# Patient Record
Sex: Female | Born: 1960 | Race: White | Hispanic: No
Health system: Southern US, Community
[De-identification: ages and names within clinical notes are randomized; demographics above are authoritative.]

## PROBLEM LIST (undated history)

## (undated) DIAGNOSIS — R51 Headache: Secondary | ICD-10-CM

## (undated) DIAGNOSIS — G8929 Other chronic pain: Secondary | ICD-10-CM

## (undated) DIAGNOSIS — M1990 Unspecified osteoarthritis, unspecified site: Secondary | ICD-10-CM

## (undated) DIAGNOSIS — F419 Anxiety disorder, unspecified: Secondary | ICD-10-CM

## (undated) DIAGNOSIS — I10 Essential (primary) hypertension: Secondary | ICD-10-CM

## (undated) DIAGNOSIS — K219 Gastro-esophageal reflux disease without esophagitis: Secondary | ICD-10-CM

## (undated) DIAGNOSIS — M5430 Sciatica, unspecified side: Secondary | ICD-10-CM

## (undated) DIAGNOSIS — G4730 Sleep apnea, unspecified: Secondary | ICD-10-CM

## (undated) DIAGNOSIS — M549 Dorsalgia, unspecified: Secondary | ICD-10-CM

## (undated) DIAGNOSIS — R0602 Shortness of breath: Secondary | ICD-10-CM

## (undated) DIAGNOSIS — J449 Chronic obstructive pulmonary disease, unspecified: Secondary | ICD-10-CM

## (undated) DIAGNOSIS — R569 Unspecified convulsions: Secondary | ICD-10-CM

## (undated) DIAGNOSIS — J40 Bronchitis, not specified as acute or chronic: Secondary | ICD-10-CM

## (undated) HISTORY — PX: TUBAL LIGATION: SHX77

## (undated) HISTORY — PX: CHOLECYSTECTOMY: SHX55

## (undated) HISTORY — PX: ABDOMINAL HYSTERECTOMY: SHX81

---

## 2004-02-12 ENCOUNTER — Emergency Department (HOSPITAL_COMMUNITY): Admission: EM | Admit: 2004-02-12 | Discharge: 2004-02-12 | Payer: Self-pay | Admitting: Emergency Medicine

## 2004-05-29 ENCOUNTER — Ambulatory Visit: Payer: Self-pay | Admitting: Family Medicine

## 2004-06-30 ENCOUNTER — Ambulatory Visit: Payer: Self-pay | Admitting: Family Medicine

## 2004-07-01 ENCOUNTER — Ambulatory Visit (HOSPITAL_COMMUNITY): Admission: RE | Admit: 2004-07-01 | Discharge: 2004-07-01 | Payer: Self-pay | Admitting: Family Medicine

## 2004-07-09 ENCOUNTER — Ambulatory Visit (HOSPITAL_COMMUNITY): Admission: RE | Admit: 2004-07-09 | Discharge: 2004-07-09 | Payer: Self-pay | Admitting: Family Medicine

## 2004-07-10 ENCOUNTER — Emergency Department (HOSPITAL_COMMUNITY): Admission: EM | Admit: 2004-07-10 | Discharge: 2004-07-10 | Payer: Self-pay | Admitting: Emergency Medicine

## 2004-07-10 ENCOUNTER — Encounter (HOSPITAL_COMMUNITY): Admission: RE | Admit: 2004-07-10 | Discharge: 2004-08-09 | Payer: Self-pay | Admitting: Family Medicine

## 2004-07-15 ENCOUNTER — Encounter (INDEPENDENT_AMBULATORY_CARE_PROVIDER_SITE_OTHER): Payer: Self-pay | Admitting: Internal Medicine

## 2004-07-15 ENCOUNTER — Ambulatory Visit: Payer: Self-pay | Admitting: Family Medicine

## 2004-07-16 ENCOUNTER — Encounter (INDEPENDENT_AMBULATORY_CARE_PROVIDER_SITE_OTHER): Payer: Self-pay | Admitting: Internal Medicine

## 2004-07-16 LAB — CONVERTED CEMR LAB
LDL Cholesterol: 105 mg/dL
VLDL: 22 mg/dL

## 2004-08-27 ENCOUNTER — Ambulatory Visit: Payer: Self-pay | Admitting: Family Medicine

## 2004-10-28 ENCOUNTER — Ambulatory Visit: Payer: Self-pay | Admitting: Family Medicine

## 2004-12-04 ENCOUNTER — Ambulatory Visit: Payer: Self-pay | Admitting: Family Medicine

## 2005-02-03 ENCOUNTER — Ambulatory Visit: Payer: Self-pay | Admitting: Family Medicine

## 2005-02-05 ENCOUNTER — Emergency Department (HOSPITAL_COMMUNITY): Admission: EM | Admit: 2005-02-05 | Discharge: 2005-02-05 | Payer: Self-pay | Admitting: Emergency Medicine

## 2005-04-17 ENCOUNTER — Ambulatory Visit: Payer: Self-pay | Admitting: Internal Medicine

## 2005-07-09 ENCOUNTER — Ambulatory Visit: Payer: Self-pay | Admitting: Internal Medicine

## 2005-07-09 ENCOUNTER — Ambulatory Visit (HOSPITAL_COMMUNITY): Admission: RE | Admit: 2005-07-09 | Discharge: 2005-07-09 | Payer: Self-pay | Admitting: Internal Medicine

## 2005-07-24 ENCOUNTER — Ambulatory Visit: Payer: Self-pay | Admitting: Internal Medicine

## 2005-08-06 ENCOUNTER — Ambulatory Visit: Payer: Self-pay | Admitting: Internal Medicine

## 2005-12-29 ENCOUNTER — Ambulatory Visit: Payer: Self-pay | Admitting: Internal Medicine

## 2006-02-04 ENCOUNTER — Ambulatory Visit: Payer: Self-pay | Admitting: Internal Medicine

## 2006-02-05 ENCOUNTER — Encounter (INDEPENDENT_AMBULATORY_CARE_PROVIDER_SITE_OTHER): Payer: Self-pay | Admitting: Internal Medicine

## 2006-02-24 ENCOUNTER — Ambulatory Visit (HOSPITAL_COMMUNITY): Admission: RE | Admit: 2006-02-24 | Discharge: 2006-02-24 | Payer: Self-pay | Admitting: Neurology

## 2006-03-09 ENCOUNTER — Ambulatory Visit: Payer: Self-pay | Admitting: Internal Medicine

## 2006-03-09 LAB — CONVERTED CEMR LAB
CO2: 23 meq/L (ref 19–32)
Calcium: 8.8 mg/dL (ref 8.4–10.5)
Creatinine, Ser: 0.8 mg/dL (ref 0.40–1.20)
Glucose, Bld: 73 mg/dL (ref 70–99)
Hemoglobin: 13.5 g/dL (ref 12.0–15.0)
Lymphocytes Relative: 33 % (ref 12–46)
MCHC: 31.2 g/dL (ref 30.0–36.0)
Monocytes Absolute: 0.3 10*3/uL (ref 0.2–0.7)
Monocytes Relative: 7 % (ref 3–11)
Neutro Abs: 2.4 10*3/uL (ref 1.7–7.7)
RBC: 4.55 M/uL (ref 3.87–5.11)

## 2006-03-16 ENCOUNTER — Encounter: Payer: Self-pay | Admitting: Internal Medicine

## 2006-03-16 DIAGNOSIS — G43909 Migraine, unspecified, not intractable, without status migrainosus: Secondary | ICD-10-CM | POA: Insufficient documentation

## 2006-03-16 DIAGNOSIS — M1990 Unspecified osteoarthritis, unspecified site: Secondary | ICD-10-CM | POA: Insufficient documentation

## 2006-03-16 DIAGNOSIS — M545 Low back pain: Secondary | ICD-10-CM

## 2006-03-16 DIAGNOSIS — J449 Chronic obstructive pulmonary disease, unspecified: Secondary | ICD-10-CM

## 2006-03-16 DIAGNOSIS — K219 Gastro-esophageal reflux disease without esophagitis: Secondary | ICD-10-CM

## 2006-03-16 DIAGNOSIS — G5600 Carpal tunnel syndrome, unspecified upper limb: Secondary | ICD-10-CM | POA: Insufficient documentation

## 2006-03-16 DIAGNOSIS — D649 Anemia, unspecified: Secondary | ICD-10-CM

## 2006-03-16 DIAGNOSIS — F3289 Other specified depressive episodes: Secondary | ICD-10-CM | POA: Insufficient documentation

## 2006-03-16 DIAGNOSIS — J4489 Other specified chronic obstructive pulmonary disease: Secondary | ICD-10-CM | POA: Insufficient documentation

## 2006-03-16 DIAGNOSIS — J45909 Unspecified asthma, uncomplicated: Secondary | ICD-10-CM | POA: Insufficient documentation

## 2006-03-16 DIAGNOSIS — I498 Other specified cardiac arrhythmias: Secondary | ICD-10-CM

## 2006-03-16 DIAGNOSIS — I499 Cardiac arrhythmia, unspecified: Secondary | ICD-10-CM | POA: Insufficient documentation

## 2006-03-16 DIAGNOSIS — R569 Unspecified convulsions: Secondary | ICD-10-CM

## 2006-03-16 DIAGNOSIS — N318 Other neuromuscular dysfunction of bladder: Secondary | ICD-10-CM

## 2006-03-16 DIAGNOSIS — F329 Major depressive disorder, single episode, unspecified: Secondary | ICD-10-CM

## 2006-03-16 DIAGNOSIS — F411 Generalized anxiety disorder: Secondary | ICD-10-CM | POA: Insufficient documentation

## 2006-03-18 ENCOUNTER — Ambulatory Visit: Payer: Self-pay | Admitting: Internal Medicine

## 2006-04-06 ENCOUNTER — Ambulatory Visit: Payer: Self-pay | Admitting: Internal Medicine

## 2006-04-06 DIAGNOSIS — J441 Chronic obstructive pulmonary disease with (acute) exacerbation: Secondary | ICD-10-CM

## 2006-04-06 DIAGNOSIS — F17200 Nicotine dependence, unspecified, uncomplicated: Secondary | ICD-10-CM | POA: Insufficient documentation

## 2006-04-06 DIAGNOSIS — B9789 Other viral agents as the cause of diseases classified elsewhere: Secondary | ICD-10-CM

## 2006-04-19 ENCOUNTER — Encounter (INDEPENDENT_AMBULATORY_CARE_PROVIDER_SITE_OTHER): Payer: Self-pay | Admitting: Internal Medicine

## 2006-05-04 ENCOUNTER — Ambulatory Visit: Payer: Self-pay | Admitting: Internal Medicine

## 2006-05-25 ENCOUNTER — Encounter (INDEPENDENT_AMBULATORY_CARE_PROVIDER_SITE_OTHER): Payer: Self-pay | Admitting: Internal Medicine

## 2006-06-14 ENCOUNTER — Encounter: Payer: Self-pay | Admitting: Internal Medicine

## 2006-06-15 ENCOUNTER — Ambulatory Visit: Payer: Self-pay | Admitting: Internal Medicine

## 2006-06-15 DIAGNOSIS — J309 Allergic rhinitis, unspecified: Secondary | ICD-10-CM | POA: Insufficient documentation

## 2006-08-12 ENCOUNTER — Ambulatory Visit: Payer: Self-pay | Admitting: Internal Medicine

## 2006-09-16 ENCOUNTER — Ambulatory Visit: Payer: Self-pay | Admitting: Internal Medicine

## 2006-09-16 ENCOUNTER — Other Ambulatory Visit: Admission: RE | Admit: 2006-09-16 | Discharge: 2006-09-16 | Payer: Self-pay | Admitting: Internal Medicine

## 2006-09-16 ENCOUNTER — Encounter (INDEPENDENT_AMBULATORY_CARE_PROVIDER_SITE_OTHER): Payer: Self-pay | Admitting: Internal Medicine

## 2006-09-16 DIAGNOSIS — M7710 Lateral epicondylitis, unspecified elbow: Secondary | ICD-10-CM | POA: Insufficient documentation

## 2006-09-16 LAB — CONVERTED CEMR LAB: OCCULT 1: NEGATIVE

## 2006-09-17 ENCOUNTER — Telehealth (INDEPENDENT_AMBULATORY_CARE_PROVIDER_SITE_OTHER): Payer: Self-pay | Admitting: *Deleted

## 2006-09-17 ENCOUNTER — Encounter (INDEPENDENT_AMBULATORY_CARE_PROVIDER_SITE_OTHER): Payer: Self-pay | Admitting: Internal Medicine

## 2006-09-21 DIAGNOSIS — D069 Carcinoma in situ of cervix, unspecified: Secondary | ICD-10-CM | POA: Insufficient documentation

## 2006-09-22 ENCOUNTER — Encounter (INDEPENDENT_AMBULATORY_CARE_PROVIDER_SITE_OTHER): Payer: Self-pay | Admitting: Internal Medicine

## 2006-09-22 ENCOUNTER — Telehealth (INDEPENDENT_AMBULATORY_CARE_PROVIDER_SITE_OTHER): Payer: Self-pay | Admitting: *Deleted

## 2006-09-23 ENCOUNTER — Encounter (INDEPENDENT_AMBULATORY_CARE_PROVIDER_SITE_OTHER): Payer: Self-pay | Admitting: Internal Medicine

## 2006-10-17 ENCOUNTER — Encounter (INDEPENDENT_AMBULATORY_CARE_PROVIDER_SITE_OTHER): Payer: Self-pay | Admitting: Internal Medicine

## 2006-11-10 ENCOUNTER — Other Ambulatory Visit: Admission: RE | Admit: 2006-11-10 | Discharge: 2006-11-10 | Payer: Self-pay | Admitting: Obstetrics and Gynecology

## 2006-11-10 ENCOUNTER — Telehealth (INDEPENDENT_AMBULATORY_CARE_PROVIDER_SITE_OTHER): Payer: Self-pay | Admitting: *Deleted

## 2006-11-10 ENCOUNTER — Encounter (INDEPENDENT_AMBULATORY_CARE_PROVIDER_SITE_OTHER): Payer: Self-pay | Admitting: Internal Medicine

## 2006-11-15 ENCOUNTER — Encounter (INDEPENDENT_AMBULATORY_CARE_PROVIDER_SITE_OTHER): Payer: Self-pay | Admitting: Internal Medicine

## 2006-11-16 ENCOUNTER — Telehealth (INDEPENDENT_AMBULATORY_CARE_PROVIDER_SITE_OTHER): Payer: Self-pay | Admitting: Internal Medicine

## 2006-11-17 ENCOUNTER — Encounter (INDEPENDENT_AMBULATORY_CARE_PROVIDER_SITE_OTHER): Payer: Self-pay | Admitting: Internal Medicine

## 2006-11-19 ENCOUNTER — Encounter (INDEPENDENT_AMBULATORY_CARE_PROVIDER_SITE_OTHER): Payer: Self-pay | Admitting: Internal Medicine

## 2006-11-30 ENCOUNTER — Encounter (INDEPENDENT_AMBULATORY_CARE_PROVIDER_SITE_OTHER): Payer: Self-pay | Admitting: Internal Medicine

## 2007-01-04 ENCOUNTER — Encounter (INDEPENDENT_AMBULATORY_CARE_PROVIDER_SITE_OTHER): Payer: Self-pay | Admitting: Internal Medicine

## 2007-02-09 ENCOUNTER — Ambulatory Visit (HOSPITAL_COMMUNITY): Admission: RE | Admit: 2007-02-09 | Discharge: 2007-02-09 | Payer: Self-pay | Admitting: Obstetrics & Gynecology

## 2007-02-24 ENCOUNTER — Encounter: Payer: Self-pay | Admitting: Family Medicine

## 2007-02-25 ENCOUNTER — Ambulatory Visit (HOSPITAL_COMMUNITY): Admission: RE | Admit: 2007-02-25 | Discharge: 2007-02-25 | Payer: Self-pay | Admitting: *Deleted

## 2007-03-02 ENCOUNTER — Ambulatory Visit (HOSPITAL_COMMUNITY): Admission: RE | Admit: 2007-03-02 | Discharge: 2007-03-02 | Payer: Self-pay | Admitting: *Deleted

## 2007-08-22 ENCOUNTER — Other Ambulatory Visit: Admission: RE | Admit: 2007-08-22 | Discharge: 2007-08-22 | Payer: Self-pay | Admitting: Obstetrics & Gynecology

## 2007-08-29 ENCOUNTER — Encounter: Payer: Self-pay | Admitting: Orthopedic Surgery

## 2007-08-29 ENCOUNTER — Ambulatory Visit (HOSPITAL_COMMUNITY): Admission: RE | Admit: 2007-08-29 | Discharge: 2007-08-29 | Payer: Self-pay | Admitting: Neurology

## 2007-09-12 ENCOUNTER — Ambulatory Visit: Payer: Self-pay | Admitting: Orthopedic Surgery

## 2007-09-12 DIAGNOSIS — M79609 Pain in unspecified limb: Secondary | ICD-10-CM

## 2007-09-12 DIAGNOSIS — M25579 Pain in unspecified ankle and joints of unspecified foot: Secondary | ICD-10-CM

## 2007-09-15 ENCOUNTER — Encounter: Payer: Self-pay | Admitting: Orthopedic Surgery

## 2007-09-26 ENCOUNTER — Telehealth: Payer: Self-pay | Admitting: Orthopedic Surgery

## 2007-09-26 ENCOUNTER — Encounter: Payer: Self-pay | Admitting: Orthopedic Surgery

## 2007-09-29 ENCOUNTER — Encounter (HOSPITAL_COMMUNITY): Admission: RE | Admit: 2007-09-29 | Discharge: 2007-10-29 | Payer: Self-pay | Admitting: Orthopaedic Surgery

## 2007-12-07 ENCOUNTER — Emergency Department (HOSPITAL_COMMUNITY): Admission: EM | Admit: 2007-12-07 | Discharge: 2007-12-07 | Payer: Self-pay | Admitting: Emergency Medicine

## 2008-01-06 ENCOUNTER — Ambulatory Visit (HOSPITAL_COMMUNITY): Admission: RE | Admit: 2008-01-06 | Discharge: 2008-01-06 | Payer: Self-pay | Admitting: Neurology

## 2008-01-13 ENCOUNTER — Emergency Department (HOSPITAL_COMMUNITY): Admission: EM | Admit: 2008-01-13 | Discharge: 2008-01-13 | Payer: Self-pay | Admitting: Emergency Medicine

## 2008-01-16 ENCOUNTER — Inpatient Hospital Stay (HOSPITAL_COMMUNITY): Admission: EM | Admit: 2008-01-16 | Discharge: 2008-01-19 | Payer: Self-pay | Admitting: Emergency Medicine

## 2008-01-17 ENCOUNTER — Ambulatory Visit: Payer: Self-pay | Admitting: Gastroenterology

## 2008-01-18 ENCOUNTER — Encounter (INDEPENDENT_AMBULATORY_CARE_PROVIDER_SITE_OTHER): Payer: Self-pay | Admitting: General Surgery

## 2009-01-09 ENCOUNTER — Emergency Department (HOSPITAL_COMMUNITY): Admission: EM | Admit: 2009-01-09 | Discharge: 2009-01-09 | Payer: Self-pay

## 2010-02-14 IMAGING — CR DG ANKLE COMPLETE 3+V*R*
3 series · 3 of 3 positions shown · non-contrast
Comparison: None

CLINICAL DATA: Fall

RIGHT ANKLE - COMPLETE 3+ VIEW

[view not recorded (1 of 3)]
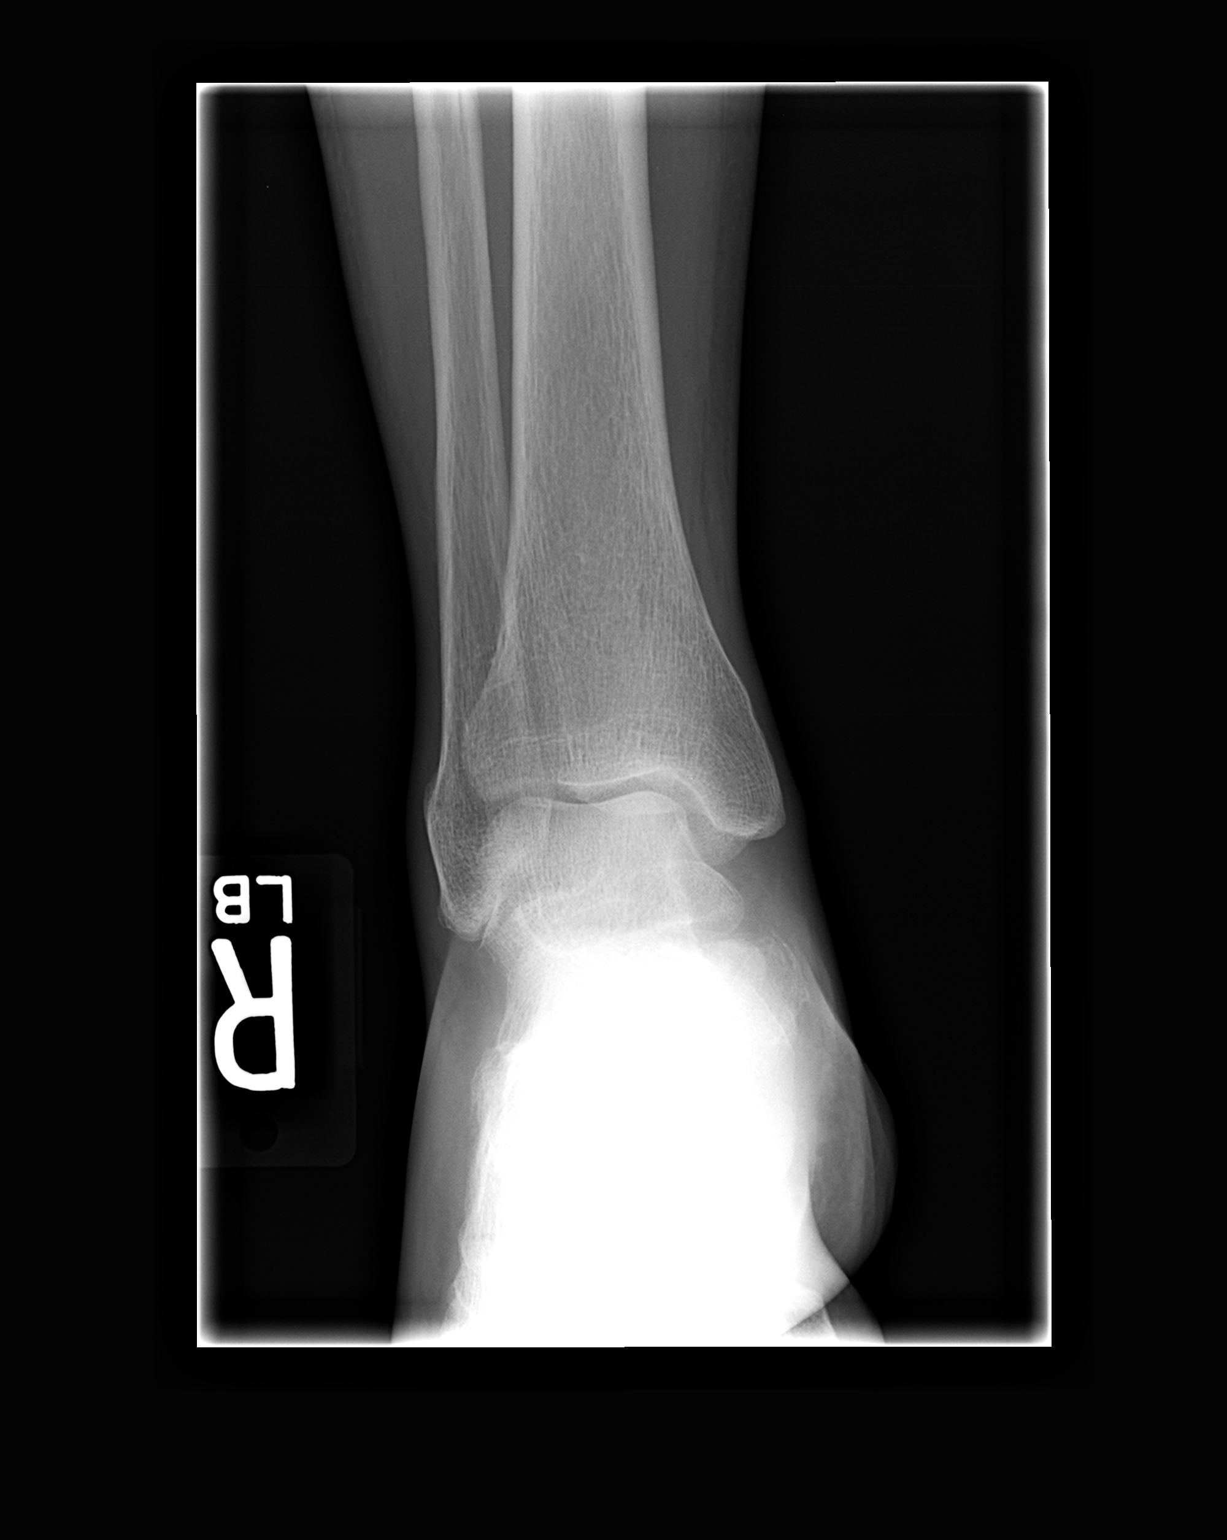

[view not recorded (2 of 3)]
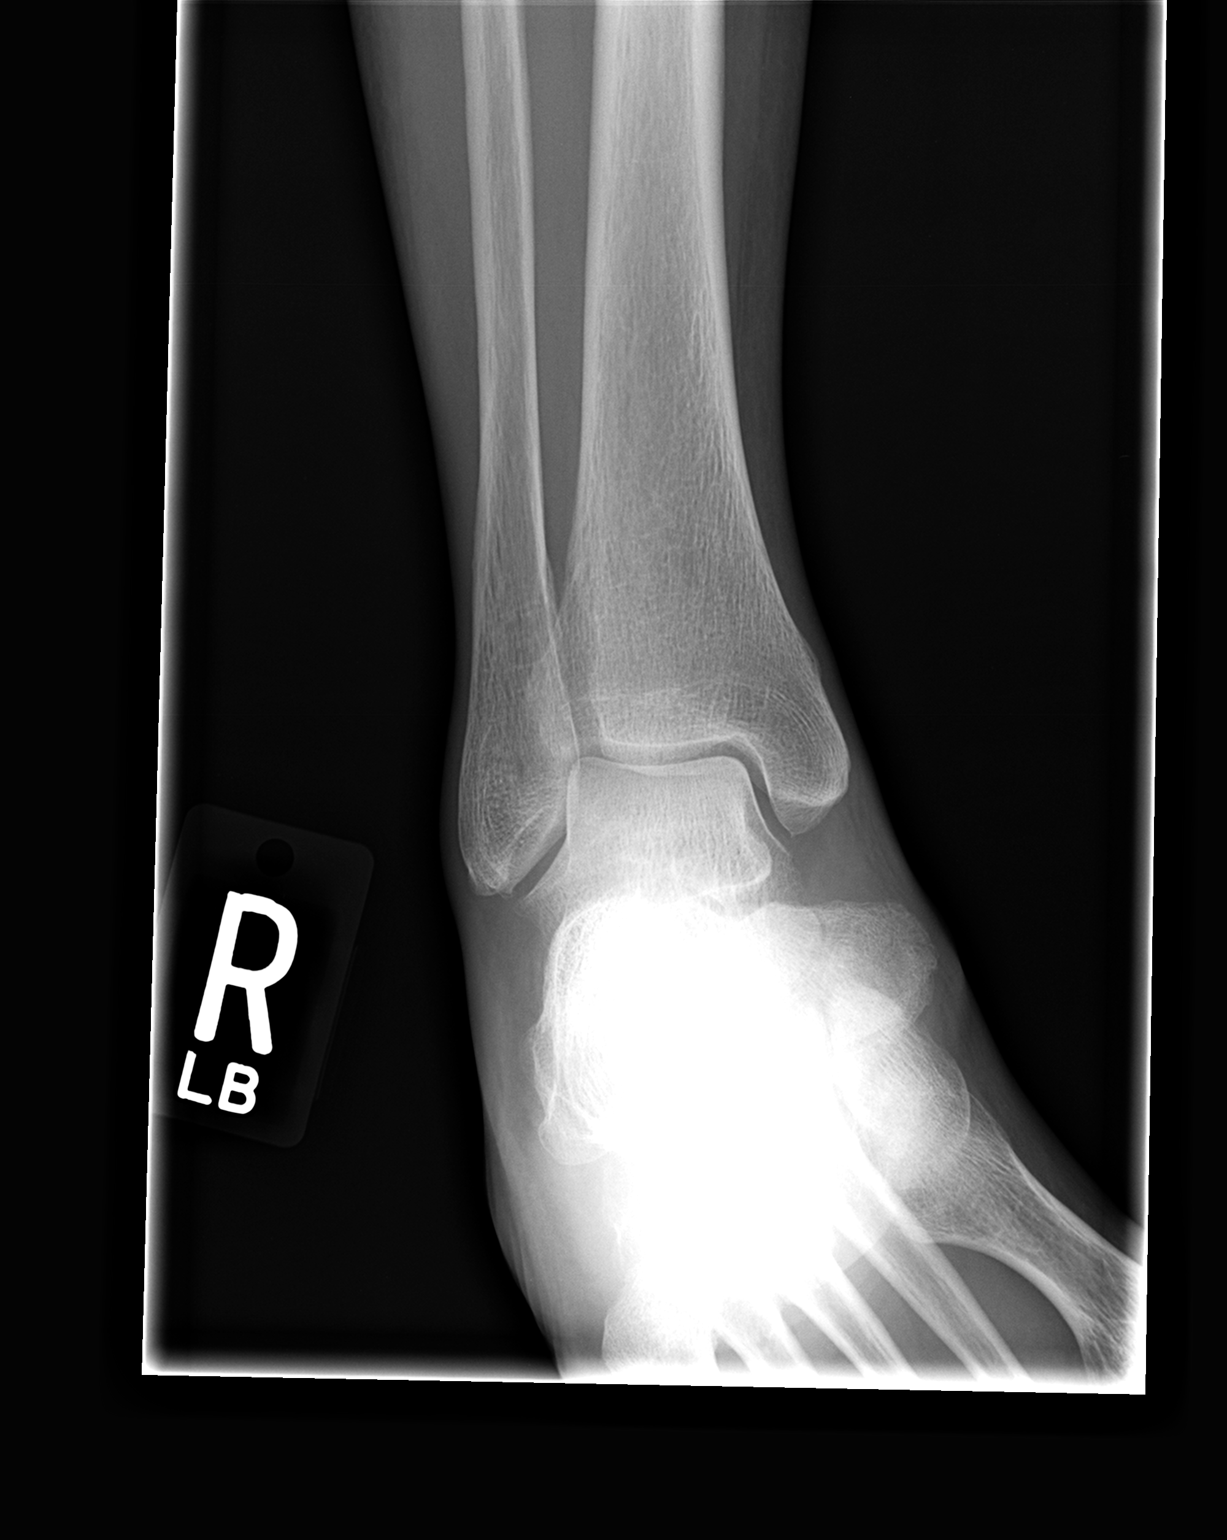

[view not recorded (3 of 3)]
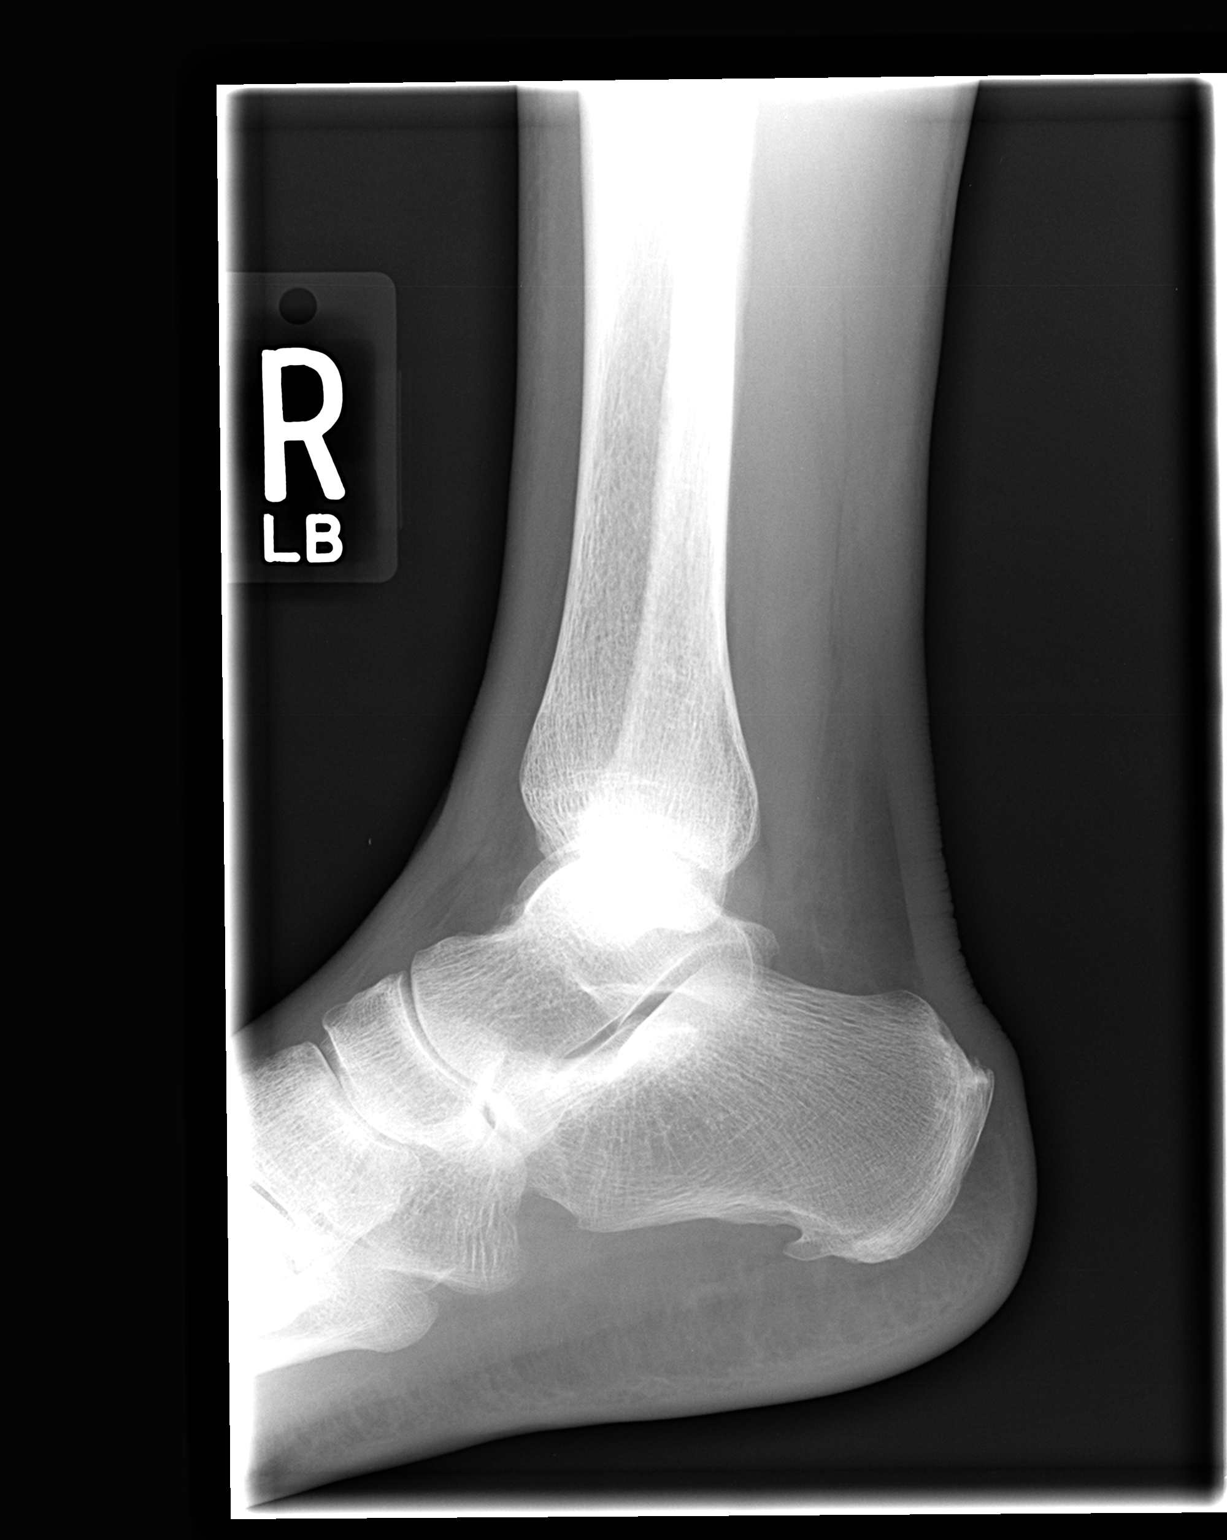

[3 of 3 positions shown; findings below may reference images not displayed]

FINDINGS: There is no evidence of fracture or dislocation.  There
is no evidence of arthropathy or other focal bone abnormality.
Soft tissues are unremarkable.
IMPRESSION: No acute findings.

## 2010-03-16 ENCOUNTER — Encounter: Payer: Self-pay | Admitting: Internal Medicine

## 2010-03-16 ENCOUNTER — Encounter: Payer: Self-pay | Admitting: Family Medicine

## 2010-03-25 NOTE — Letter (Signed)
Summary: Historic Patient File  Historic Patient File   Imported By: Lind Guest 01/13/2010 15:45:41  _____________________________________________________________________  External Attachment:    Type:   Image     Comment:   External Document

## 2010-05-28 LAB — POCT I-STAT, CHEM 8
Chloride: 106 meq/L (ref 96–112)
HCT: 43 % (ref 36.0–46.0)
Hemoglobin: 14.6 g/dL (ref 12.0–15.0)
Potassium: 4.3 meq/L (ref 3.5–5.1)
Sodium: 141 meq/L (ref 135–145)

## 2010-05-28 LAB — COMPREHENSIVE METABOLIC PANEL
ALT: 16 U/L (ref 0–35)
Alkaline Phosphatase: 59 U/L (ref 39–117)
BUN: 9 mg/dL (ref 6–23)
CO2: 27 meq/L (ref 19–32)
Calcium: 8.7 mg/dL (ref 8.4–10.5)
GFR calc non Af Amer: >60 mL/min (ref >60)
Glucose, Bld: 76 mg/dL (ref 70–99)
Potassium: 4 meq/L (ref 3.5–5.1)
Sodium: 136 meq/L (ref 135–145)

## 2010-05-28 LAB — CBC
Hemoglobin: 14.5 g/dL (ref 12.0–15.0)
MCHC: 35.7 g/dL (ref 30.0–36.0)
MCV: 93.1 fL (ref 78.0–100.0)
RBC: 4.36 MIL/uL (ref 3.87–5.11)
WBC: 7.3 10*3/uL (ref 4.0–10.5)

## 2010-05-28 LAB — URINALYSIS, ROUTINE W REFLEX MICROSCOPIC
Bilirubin Urine: NEGATIVE
Glucose, UA: NEGATIVE mg/dL
Hgb urine dipstick: NEGATIVE
Specific Gravity, Urine: 1.007 (ref 1.005–1.030)
Urobilinogen, UA: 0.2 mg/dL (ref 0.0–1.0)
pH: 6 (ref 5.0–8.0)

## 2010-05-28 LAB — TYPE AND SCREEN: Antibody Screen: NEGATIVE

## 2010-05-28 LAB — POCT CARDIAC MARKERS
CKMB, poc: 1.7 ng/mL (ref 1.0–8.0)
Myoglobin, poc: 115 ng/mL (ref 12–200)
Troponin i, poc: <0.05 ng/mL (ref 0.00–0.09)

## 2010-05-28 LAB — LACTIC ACID, PLASMA: Lactic Acid, Venous: 0.8 mmol/L (ref 0.5–2.2)

## 2010-07-08 NOTE — Discharge Summary (Signed)
[name], [name]                ACCOUNT NO.:  [id number]   MEDICAL RECORD NO.:  [id number]          PATIENT TYPE:  INP   LOCATION:  A336                          FACILITY:  [facility]   PHYSICIAN:  Skeet Latch, DO    DATE OF BIRTH:  1960/09/06   DATE OF ADMISSION:  01/16/2008  DATE OF DISCHARGE:  11/26/2009LH                               DISCHARGE SUMMARY   DISCHARGE DIAGNOSES:  1. Acute cholecystitis with cholelithiasis status post laparoscopic      cholecystectomy.  2. Abdominal pain, resolved.  3. Hypokalemia.  4. History of chronic back pain.  5. History of coronary artery disease.  6. History of chronic bronchitis/asthma.  7. History of tobacco abuse.   BRIEF HOSPITAL COURSE:  1. This is a 50 year old Caucasian female who presented with abdominal      pain to the emergency room.  The patient states that she started      having abdominal pain, nausea, vomiting, and fever.  She stated      pain was 7-8/10.  The patient states that it was dull in nature.      It was worsened by food and no relieving factors.  The patient      states that it was associated with vomiting and nausea.  The      patient states that she could not tolerate any fluids.  She denied      any chest pain, shortness of breath, but did admit to some      headaches, but the patient has a history of chronic headaches.  The      patient presented to the ER, initial labs showed amylase of 31,      white count of 7.9, hemoglobin 12.0, and platelet count of 52.      Urinalysis showed moderate bilirubin, sodium of 135, potassium is      2.7, chloride 99, CO2 of 30, glucose 124, BUN 13, creatinine 0.81,      total bilirubin was 4.3, alkaline phosphates was 224, AST 126, ALT      116, lipase is 55.  Alcohol level was less than 5.  Her abdominal      CT and pelvic CT showed mild gallbladder wall thickening with      suggestions of mild enhancement.  They could not exclude      cholecystitis, significant aortoiliac  atheromatous disease      considering age and gender.  The patient was admitted, placed on IV      antibiotics.  She was placed on IV fluids and IV pain medications.      GI and Surgery were consulted.  Her liver function tests were      repeated.  They did recommend abdominal ultrasound.  The patient      had abdominal ultrasound which showed mildly dilated common bile      duct, without directly visualized choledocholithiasis.  2. Sludge was present in gallbladder.  3. Atherosclerotic calcification of the aorta.   Surgery also saw the patient and it was decided that the patient will  need laparoscopic cholecystectomy.  The patient did have a laparoscopic  cholecystectomy with cholangiogram.  There was no complications and the  patient did well after the surgery.  There were no stones noted on her  cholangiogram.  The patient did well after surgery.  She was placed on  postop orders and the patient was requesting to go home.  At this time,  it was felt the patient can go home to followup with her primary care  physician.   DISCHARGE MEDICATIONS:  1. Klonopin 1 mg three times a day.  2. Robaxin 500 mg twice a day.  3. Imipramine 50 mg at bedtime.  4. Aspirin 81 mg daily.  5. Diltiazem 120 mg once a day.  6. Promethazine 25 mg every 6 hours as needed.  7. Singulair 10 mg once a day.  8. Lyrica 100 mg three times a day.  9. Paxil 20 mg once a day.  10.Zofran 4 mg q.6 h. as needed.  11.Advair 500/50 two puffs twice daily.  12.ProAir 90 mcg twice a day as needed.  13.Proventil inhaler twice a day as needed.  14.K-Dur 20 mEq p.o. daily.  15.Darvocet-N 100 every 6 hours p.r.n. for pain.   PHYSICAL EXAMINATION:  Vitals on discharge, temperature is 99.6, pulse  88, respirations 19, blood pressure 143/95, and sat 96% on room air.   LABORATORY DATA:  White count is 5.9, hemoglobin 9.7, hematocrit 27.8,  platelet count of 198,000.  Sodium 140, potassium 3.1, chloride is 104,  CO2 of 29,  glucose 82, BUN 7, and creatinine 0.72   CONDITION ON DISCHARGE:  Stable.   DISPOSITION:  The patient will be discharged to home.   DISCHARGE INSTRUCTIONS:  The patient will maintain a regular diet.  Her  wound care is per Surgery's orders.  Keep her wound clean.  Keep her  incisions clean and dry. Activity, increase her activity slowly.  The  patient to follow up with Dr. Lovell Sheehan in 1 week.  The patient is to  followup with her primary care physician in 1-2 weeks.  The patient will  all her medications as directed.  The patient is to return to the  emergency room if she has any severe abdominal pain or intractable  nausea or vomiting and/or call 911.      Skeet Latch, DO  Electronically Signed     SM/MEDQ  D:  01/19/2008  T:  01/19/2008  Job:  [id number]   cc:   Dr. Lovell Sheehan

## 2010-07-08 NOTE — H&P (Signed)
[name], ANDREONI                ACCOUNT NO.:  [id number]   MEDICAL RECORD NO.:  [id number]          PATIENT TYPE:  INP   LOCATION:  A336                          FACILITY:  [facility]   PHYSICIAN:  Lonia Blood, M.D.      DATE OF BIRTH:  1960/03/19   DATE OF ADMISSION:  01/16/2008  DATE OF DISCHARGE:  LH                              HISTORY & PHYSICAL   PRIMARY CARE PHYSICIAN:  The patient is unassigned to [place].   PRESENTING COMPLAINT:  Abdominal pain.   HISTORY OF PRESENT ILLNESS:  The patient is a 50 year old female  presenting with chief complaint of abdominal pain to the ER.  She has  apparently been doing okay until 2 days prior to coming in when she  started having abdominal pain, nausea, vomiting, and fever.  The pain  was rated as 7-8/10,  centrally located.  It was dull.  It was worsened  by food and no exact relieving factor.  Was associated with vomiting and  nausea.  The patient was unable to keep anything down.  She denied any  diarrhea, no hematochezia nor hematemesis.  She denied any shortness of  breath, no chest pain.  She has had some headaches, but also has history  of chronic headaches.  She denied any weakness, no abdominal distention.   PAST MEDICAL HISTORY:  Significant for:   1. Coronary artery disease.  2. Asthma.  3. Chronic back pain.  4. Chronic bronchitis.  5. History of dilatation and curettage.  6. Status post hysterectomy and tubal ligation.   ALLERGIES:  Has no known drug allergies.   MEDICATIONS:  1. Aspirin 81 mg daily.  2. Lyrica 75 mg p.o. t.i.d.  3. Diltiazem ER 120 mg daily.  4. Klonopin 1 mg p.o. b.i.d.  5. Protonix 40 mg daily.  6. Singulair 10 mg q.h.s.  7. Paroxetine 20 mg daily.  8. Phenergan 25 mg p.r.n.  9. Imipramine 100 mg q.h.s.  10.Robaxin 500 mg p.r.n.  11.Advair Diskus 550 one puff daily.  12.ProAir 2 puffs b.i.d. p.r.n.  13.Proventil 2 puffs b.i.d. p.r.n.  14.Albuterol inhaler 2 puffs every 4 hours.   SOCIAL HISTORY:   The patient lives in [place].  She smokes about 1/2  to 1 pack per day.  Occasional alcohol.  Denied any IV drug use.   FAMILY HISTORY:  Significant for hypertension and coronary artery  disease.   REVIEW OF SYSTEMS:  Negative except per HPI.   PHYSICAL EXAMINATION:  Temperature was high at 102, blood pressure is  94/64, her pulse rate was 90, respiratory 22, sats 100% on room air.  GENERAL:  The patient looks acutely ill in no acute distress.  HEENT:  PERRL, EOMI.  NECK:  Supple.  No JVD, no lymphadenopathy.  RESPIRATORY:  She has good air entry bilaterally.  No wheezes, no rales.  CARDIOVASCULAR:  She has S1, S2, no audible murmurs.  ABDOMEN:  Soft, not tender at this point, in no acute distress.  EXTREMITIES:  No edema, cyanosis or clubbing.   LABORATORIES:  Showed an amylase of 31.  White count  7.9, hemoglobin  12.0, platelet count 152.  Her urinalysis showed moderate bilirubin;  otherwise negative.  Sodium 135, potassium 2.7, chloride 99, CO2 30,  glucose 124, BUN 13, creatinine 0.81.  Total bilirubin is 4.3, alkaline  phosphatase 224.  AST 126, ALT 116.  Total protein 6.0. Albumin 2.6.  Calcium 8.1.  Rapid strep testing was negative.  Direct bilirubin was  2.8.  Lipase 55.  Alcohol level less than 5.   Abdominal CT and pelvic CT showed mild gallbladder wall thickening with  suggestion of some mild enhancement.  Could not exclude acute  cholecystitis, significant aortoiliac atheromatous disease considering  age and gender, and no acute or significant findings in the pelvis.   ASSESSMENT:  Therefore this is a 50 year old female presenting with  nausea, vomiting, some abdominal pain, fever, and findings on CT  suggestive of gallbladder disease.  The differentials are acute  cholecystitis.  Could be acalculous or calculus in nature.  No evidence  of UTI.  No evidence of pancreatitis.   PLAN:  1. Fever, nausea, vomiting, and abdominal pain.  Will treat this as a      case  of acute cholecystitis.  Will have some bowel rest, IV      antibiotics, IV fluids, and IV pain medicine using Dilaudid.  Will      also give her anti-nausea medications including Zofran and      Phenergan.  Once the patient improves, she will definitely need her      gallbladder removed through cholecystectomy.  Will therefore get GI      and surgical consult.  2. Hypokalemia.  This is most likely from her nausea, vomiting.  I      will replete it both IV and if possible p.o.  3. Tobacco abuse.  The patient will be counseled extensively on      tobacco use.  4. Chronic bronchitis and asthma.  I will put patient on nebulizers in      the hospital.  5. Chronic back pain.  Again, the patient is getting medications for      that, and we will observe her closely.  6. History of coronary artery disease.  The patient has no chest pain      or any evidence of coronary artery disease at this point.  We will      continue to monitor in the hospital, and if needed will check      serial cardiac enzymes.  Otherwise, further treatment will depend      on GI and surgical consult and their inputs.      Lonia Blood, M.D.  Electronically Signed     LG/MEDQ  D:  01/17/2008  T:  01/17/2008  Job:  [id number]

## 2010-07-08 NOTE — Consult Note (Signed)
[name], [name]                ACCOUNT NO.:  [id number]   MEDICAL RECORD NO.:  [id number]          PATIENT TYPE:  INP   LOCATION:  A336                          FACILITY:  [facility]   PHYSICIAN:  Kassie Mends, M.D.      DATE OF BIRTH:  Jun 11, 1960   DATE OF CONSULTATION:  DATE OF DISCHARGE:                                 CONSULTATION   REFERRING:  [facility].   REASON FOR CONSULTATION:  Hyperbilirubinemia.   HISTORY OF PRESENT ILLNESS:  [name] is a 50 year old Caucasian  female.  She has had a 4-day history of acute onset of malaise, nausea,  vomiting, diarrhea, fever and chills.  She has noticed orange urine as  well.  She has had some vague midabdominal pain which radiates to both  sides.  She had previously been constipated.  She is now having 4-5  loose stools per day.  She denies any rectal bleeding, melena or mucus  in her stools.  She rates her abdominal pain 9/10 at worst on pain  scale.  It is intermittent, it usually lasts anywhere from 20-30 minutes  at a time.  She tells me she has a good appetite but has been unable to  eat because of being sick.  She denies any ill contacts.  She denies any  alcohol use, but does admit to heavy alcohol use, quit drinking in 2000.  She tells me she drank heavily for about 4 years.  She did get a recent  tattoo to her right upper back about 3 months ago.  She has another  tattoo on her right leg that she had in 1999.  Her pain is not  necessarily worse postprandially.  She does keep heartburn.  Her  Nexium was changed to omeprazole because of insurance reasons.  She has  also had sore throat.  She denies being sexually active in the last 3  years since her husband died.  She tells me she has had a lifetime  history of three sexual partners.  She uses over-the-counter ibuprofen  200-400 mg t.i.d. and was previously on 800 mg t.i.d. for chronic back  pain.  CT scan of abdomen and pelvis show fatty liver, gallbladder wall  thickening and possible acute cholecystitis nonspecific.  Total  bilirubin was 4.3, direct 2.8, alkaline phosphatase 224, AST 126, ALT  116, total protein 6.1, albumin 2.6, amylase 31, lipase was normal.  Her  LFTs were normal when she was seen January 13, 2008, in the emergency  room.  Her urinalysis positive for 85,000 colonies per mL of  diphtheroids corynebacterium.  She has been seen and evaluated by  surgeon, Dr. Lovell Sheehan.   PAST MEDICAL AND SURGICAL HISTORY:  1. Coronary disease.  2. Asthma.  3. Back pain.  4. Chronic bronchitis.  5. Hysterectomy.  6. Heavy alcohol use for about 4 years, quitting in 2000.   MEDICATIONS PRIOR TO ADMISSION:  1. Aspirin 81 mg daily.  2. Lyrica - unsure of dose t.i.d.  3. Diltiazem ER 20 mg daily.  4. Klonopin 1 mg b.i.d.  5. Protonix 40 mg  daily.  6. Singulair 10 mg daily.  7. Phenergan p.r.n.  8. Paroxetine 20 mg daily.  9. Imipramine 100 mg nightly.  10.Robaxin 5 mg b.i.d.  11.Advair Diskus 5/500 mcg daily.  12.ProAir.  13.Proventil.  14.Albuterol.   ALLERGIES:  NO KNOWN DRUG ALLERGIES.   FAMILY HISTORY:  Mother deceased when the patient was a child secondary  to carcinoma, unknown etiology.  Father deceased secondary to alcoholic  cirrhosis.  She has one son with Hodgkin's disease.  One brother  deceased secondary to homicide.  She has five living siblings.  There is  no family history of colorectal carcinoma or other chronic GI problems.   SOCIAL HISTORY:  [name] is a widow.  She has been married three  times.  Her last husband died in a train accident 3 years ago.  She has  been divorced twice.  She is disabled due to her chronic back pain.  She  has five sons and lives with her 56 year old son.  She denies any drug  use, but has smoked marijuana in the past.  She has half-pack cigarette  use for about the last 30 years.   REVIEW OF SYSTEMS:  She has had swelling to both her hands.  She has  noticed a nonproductive cough  and tells me this is her baseline.  She  denies any shortness of breath or hemoptysis.  The patient note she has  had multiple falls.  She tells me she does falls out.  She tells me  she was cooking dinner and fell out per her son.   PHYSICAL EXAMINATION:  VITAL SIGNS:  Temperature 98.7, pulse 88,  respirations 18, blood pressure 110/67.  GENERAL:  She is alert, oriented, pleasant and cooperative Caucasian  female in no acute distress.  HEENT:  Sclerae mildly icteric.  Conjunctiva pink.  Oropharynx pink and moist without any lesions.  NECK:  Supple without any mass or thyromegaly.  CHEST:  Heart regular rate and rhythm.  Normal S1-S2.  No murmurs,  clicks, rubs or gallops.  LUNGS:  With inspiratory and expiratory  wheezes bilaterally, but no acute distress.  ABDOMEN:  Positive bowel sounds x4.  No bruits auscultated.  The abdomen  is soft, nondistended.  She has mild tenderness to her right middle  abdomen and around the umbilicus.  There is no rebound tenderness or  guarding.  No hepatosplenomegaly or mass.  Negative Murphy sign  EXTREMITIES:  Without clubbing.  She has trace edema to her left  hand/digits.  No lower extremity edema.   LABORATORY STUDIES:  Hemoglobin 12, hematocrit 34.4, platelets 152.  White blood cell count 7.9, calcium 8.1, sodium 135, potassium 2.7,  chloride 99, CO2 of 30, BUN 13, creatinine 0.81 and glucose 124.  Urinalysis positive for bilirubin, urobilinogen and she was strep  negative.   IMPRESSION:  1. [name] is a 50 year old Caucasian female with a 4-day history      of nausea, vomiting, acute abdominal pain and constitutional      symptoms.  CT scan is nonspecific and suggests possible gallbladder      wall thickening as well as fatty liver.  Differentials include      acute cholecystitis, hepatitis secondary to viral, or she may have      passed a stone through her common bile duct.  She has history of      alcohol abuse, but states she is not  consuming any alcohol at this      time,  however, these findings can be seen with alcoholic hepatitis      and cholestasis as well.  She is taking a significant amount of      ibuprofen which should be monitored; however, I do not feel that it      is necessarily the cause of her transaminitis given the fact that      she has had an acute bump in her numbers.  2. She has hypokalemia.   PLAN:  1. Agree with pending ultrasound to rule out cholelithiasis.  2. I would like The her to hold her ibuprofen at this time.  3. Will get a serum alcohol, acute hepatitis panel and recheck her      potassium now.  4. Will check LFTs and MET-7 in the morning.  5. Further recommendations pending ultrasound.  6. This case has been discussed with Dr. Kassie Mends.   Thank you [facility] for allowing [place] to participate in the care of  [name].      Lorenza Burton, N.P.      Kassie Mends, M.D.  Electronically Signed    KJ/MEDQ  D:  01/16/2008  T:  01/16/2008  Job:  [id number]

## 2010-07-08 NOTE — Op Note (Signed)
NAMEJOLIYAH, LIPPENS                ACCOUNT NO.:  [id number]   MEDICAL RECORD NO.:  [id number]          PATIENT TYPE:  INP   LOCATION:  A336                          FACILITY:  [facility]   PHYSICIAN:  Dalia Heading, M.D.  DATE OF BIRTH:  [birth year]   DATE OF PROCEDURE:  01/18/2008  DATE OF DISCHARGE:                               OPERATIVE REPORT   PREOPERATIVE DIAGNOSES:  Cholecystitis, cholelithiasis, jaundice.   POSTOPERATIVE DIAGNOSES:  Cholecystitis, cholelithiasis, jaundice.   PROCEDURE:  Laparoscopic cholecystectomy with cholangiograms.   SURGEON:  Dalia Heading, MD   ANESTHESIA:  General endotracheal.   INDICATIONS:  The patient is a 50 year old white female who presents to  the hospital with worsening right upper quadrant abdominal pain, nausea,  vomiting.  She was also noted to have elevated liver enzyme tests as  well as a total bilirubin.  These have subsequently subsided, though  they have not returned to normal yet.  The patient now comes to the  operating room for a laparoscopic cholecystectomy with cholangiograms.  The risks and benefits of the procedure including bleeding, infection,  hepatobiliary injury, the possibly of an open procedure were fully  explained to the patient, gave informed consent.   PROCEDURE NOTE:  The patient was placed in the supine position.  After  induction of general endotracheal anesthesia, the abdomen was prepped  and draped using the usual sterile technique with Betadine.  Surgical  site confirmation was performed.   A supraumbilical incision was made down to the fascia.  Veress needle  was then introduced into the abdominal cavity and confirmation of  placement was done using the saline drop test.  The abdomen was then  insufflated to 16 mmHg pressure.  An 11-mm trocar was introduced into  the abdominal cavity under direct visualization without difficulty.  The  patient was placed in reverse Trendelenburg position.  An additional  11-  mm trocar was placed in the epigastric region, and 5-mm trocar was  placed in the right upper quadrant and right flank regions.  Liver was  inspected and noted to be within normal limits.  The gallbladder was  noted to be somewhat edematous.  The gallbladder was retracted superior  and laterally.  The dissection was begun around the infundibulum of the  gallbladder.  The cystic duct was first identified.  Its juncture to the  infundibulum fully identified.  A single EndoClip was placed proximally  on the cystic duct.  An incision was made into the cystic duct, and a  cholangiocatheter inserted.  Under digital fluoroscopy, a cholangiogram  was performed.  Dye flowed freely into the duodenum without evidence of  any hepatobiliary tree filling defects.  The system was flushed with  normal saline, and the cholangiocatheter removed.  Multiple EndoClips  were placed distally on the cystic duct, and the cystic duct was  divided.  The cystic artery was likewise ligated and divided.  The  gallbladder then freed away from the gallbladder fossa using Bovie  electrocautery.  The gallbladder was delivered through the epigastric  trocar site using Endocatch bag.  The gallbladder  fossa was inspected,  and no abnormal bleeding or bile leakage was noted.  Surgicel was placed  in the gallbladder fossa.  All fluid were then evacuate from the  abdominal cavity prior to removal of the trocars.   All wounds were irrigated with normal saline.  All wounds were checked  with 0.5% Sensorcaine.  The supraumbilical fascia was reapproximated  using an 0 Vicryl interrupted suture.  All skin incisions were closed  using staples.  Betadine ointment and dry sterile dressings were  applied.   All tape and needle counts were correct at the end of the procedure.  The patient was extubated in the operating room and went back to  recovery room, awake in stable condition.   COMPLICATIONS:  None.   SPECIMEN:   Gallbladder.   BLOOD LOSS:  Minimal.      Dalia Heading, M.D.  Electronically Signed     MAJ/MEDQ  D:  01/18/2008  T:  01/18/2008  Job:  [id number]   cc:   R. Roetta Sessions, M.D.  [address]  [place]  [place] [address]

## 2010-07-08 NOTE — Cardiovascular Report (Signed)
[name], [name]                ACCOUNT NO.:  [id number]   MEDICAL RECORD NO.:  [id number]          PATIENT TYPE:  OIB   LOCATION:  2852                         FACILITY:  [facility]   PHYSICIAN:  Darlin Priestly, MD  DATE OF BIRTH:  07-Dec-1960   DATE OF PROCEDURE:  03/02/2007  DATE OF DISCHARGE:                            CARDIAC CATHETERIZATION   PROCEDURE:  Tilt table test.   CARDIOLOGIST:  Darlin Priestly, MD   COMPLICATIONS:  None.   INDICATIONS:  [name] is a 50 year old female patient of Dr. Kem Boroughs and Dr. Gerilyn Pilgrim with a history of COPD, back pain, reflux  disease, history of recurrent presyncopal episodes.  She had a negative  Cardiolite scan in 2006 with normal EF.  She is now referred for tilt  table test to rule out possible neurocardiogenic syncope.   DESCRIPTION OF PROCEDURE:  After informed consent was obtained, the  patient was brought to the cardiac catheterization lab in fasting state.  She was then placed in the supine position, and hemodynamic measurements  obtained.  Resting blood pressure was 120/78, heart rate of 67.  This  was monitored for approximately 5 minutes.  She was tilted to 70 degree  heads-up position which was maintained for approximately 30 minutes.  The patient had no significant change in heart rate or blood pressure.  She did complain of mild chest pain at the end of the 30-minute period  with a blood pressure of 90/73 and heart rate of 91.  Chest pain lasted  only 1-2 minutes and then resolved upon returning her to supine  position.  Isuprel infusion was not given secondary to complaint of  chest pain.  She was monitored again for 5 minutes in a supine position  with no change in her heart rate or blood pressure.  Chest pain  completely resolved.   CONCLUSION:  Negative heads-up tilt table testing.  Isuprel infusion was  not initiated secondary to complaint of chest pain during the procedure  which was completely resolved  by the conclusion of the test.      Darlin Priestly, MD  Electronically Signed     RHM/MEDQ  D:  03/02/2007  T:  03/02/2007  Job:  [id number]   cc:   Darleen Crocker A. Gerilyn Pilgrim, M.D.  Dani Gobble, MD

## 2010-07-08 NOTE — Op Note (Signed)
[name], [name]                ACCOUNT NO.:  [id number]   MEDICAL RECORD NO.:  [id number]          PATIENT TYPE:  AMB   LOCATION:  DAY                           FACILITY:  [facility]   PHYSICIAN:  Lazaro Arms, M.D.   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   OPERATION PERFORMED:  Partial laser colpectomy, laser ablation of  vaginal cuff apex.   SURGEON:  Lazaro Arms, M.D.   ANESTHESIA:  Saddle block.   FINDINGS:  The patient had a biopsy performed in the office under  colposcopic direction which revealed an area of  high grade dysplasia of  the vaginal cuff apex where the vagina was closed after hysterectomy.  It was mostly on the posterior lip, posterior vagina but was also a  little bit in the anterior vagina down into the corners.   DESCRIPTION OF PROCEDURE:  The patient was taken to the operating room  and placed in the sitting position, underwent saddle block.  Placed in  dorsal lithotomy position.  Speculum was placed.  3% acetic acid was  used.  Colposcopy was performed and confirmed the findings that we had  seen above.  Holmium laser used power of 1.5 rate of 20 per second and  the area was ablated through the entire thickness of the epithelium.  A  good margin was obtained around all of the dysplasia.  The patient  tolerated the procedure well.  There was no bleeding.  Monsel's was  placed on the vaginal cuff.  She will be seen back in the office in one  month for follow-up.      Lazaro Arms, M.D.  Electronically Signed     LHE/MEDQ  D:  02/09/2007  T:  02/09/2007  Job:  [id number]

## 2010-07-11 NOTE — H&P (Signed)
[name], [name]                ACCOUNT NO.:  [id number]   MEDICAL RECORD NO.:  [id number]          PATIENT TYPE:  EMS   LOCATION:  ED                            FACILITY:  [facility]   PHYSICIAN:  Calvert Cantor, M.D.     DATE OF BIRTH:  Jul 18, 1960   DATE OF ADMISSION:  07/10/2004  DATE OF DISCHARGE:  LH                                HISTORY & PHYSICAL   CHIEF COMPLAINT:  Chest pain.   HISTORY OF PRESENT ILLNESS:  This is a 50 year old white female who states  that she had chest pain yesterday while she was sitting in her yard talking  with some friends.  The pain was left-sided and radiated to her left arm.  She is unable to explain how long it lasted.  She said she went to lay down  and eventually fell asleep.  It was not associated with any palpitations or  diaphoresis.  She noted some mild shortness of breath.  The patient states  that the pain returned again today while she was sitting awaiting rehab.  She was told to come to the ER.  The patient also states that while she is  walking the pain does get worse and eases up with rest.   REVIEW OF SYSTEMS:  Is positive for dry cough which she said she has almost  all of the time.  It is positive for shortness of breath with exertion.  The  patient does have a history of asthma.  It is negative for headaches,  abdominal pain, diarrhea, dysuria, fever.  She also complains of lower back  pain which she has a history of and has been undergoing testing for.   PAST MEDICAL HISTORY:  1.  Asthma.  2.  Lower back pain.  3.  History of bronchitis in the past.   PAST SURGICAL HISTORY:  Significant for a complete hysterectomy.   ALLERGIES:  No known drug allergies.   SOCIAL HISTORY:  She is a smoker.  She smokes one pack per day since she was  50 years old.  She was a heavy alcoholic, however, stopped 2-3 years ago.  She is unmarried.  She has five boys.   FAMILY HISTORY:  Significant for a mother who passed away with cancer of  unknown  source.  Father passed away with cirrhosis of the liver.  She has  one sister who has a pacemaker, however, she does not know why.  Her son has  Hodgkin's disease.  He is 25 years old.  She has three other sisters and two  brothers who are healthy.   CURRENT MEDICATIONS:  1.  Nexium 40 mg daily.  2.  Lyrica 50 mg daily.  3.  Albuterol inhaler p.r.n.  4.  Advair discus 500/50 b.i.d.  5.  Celebrex 200 mg daily.  The Lyrica and the Celebrex were started two      weeks ago by Dr. Truddie Coco.  6.  In addition, she has taken Lortab in the past for pain but this was      discontinued by Dr. Truddie Coco, however, yesterday she states  that she took      one along with one Xanax which she obtained from her sister.   PHYSICAL EXAMINATION:  VITAL SIGNS:  Temperature 97 degrees, blood pressure  129/80, pulse 57, respiratory rate 18, pulse oximetry is 100% on room air.  GENERAL:  The pain was rated as an 9/10 on admission, currently she is not  having pain.  HEENT:  Atraumatic, normocephalic.  Pupils equal, round, and reactive to  light and accommodation.  Extraocular muscles intact.  Oral mucosa is moist.  NECK:  Supple.  HEART:  Regular rate and rhythm.  No murmurs.  Bradycardic.  LUNGS:  Clear bilaterally.  ABDOMEN:  Soft, nontender, nondistended, bowel sounds are positive.  EXTREMITIES:  Showed no clubbing, cyanosis, or edema.   LABORATORY DATA:  White count is 3.5, hemoglobin 14.3, hematocrit 40.4, MCV  90, platelets 140.  PT 12.4, INR 0.9, D-dimer is 0.22.  Sodium 141,  potassium 3.9, chloride 109, bicarbonate 27, glucose 75.  BUN 14, creatinine  0.9, total bilirubin 0.5, alkaline phosphatase 55, AST 25, ALT 22, albumin  3.9, calcium 8.9.  Cardiac markers first set is negative.  Urine drug screen  is positive for benzodiazepines, opiates and cannabis.  Imaging:  The  patient has had an MRI of her cervical lumbar and thoracic spine.  Significant finding is left foraminal disc herniation L5-S1 with   questionable mass-affect upon exiting left L5 root.  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 The  patient also has an extra thoracic vertebrae.   ASSESSMENT AND PLAN:  This is a 50 year old white female who complains of  chest pain at rest, worsens with movement and is relieved on it's own.  The  patient's pain was relieved before she made it to the ER without getting any  medications.  I am going to admit her and rule out angina.  I will obtain a  consult with Dr. Domingo Sep.  She will have a lipid profile.  A consult will  be placed for smoking cessation.  In addition, she will receive sublingual  nitroglycerin for pain.  The patient is bradycardic as well, the cause is  unknown apparently, but she has a family history of a sister who had a  pacemaker placed.   In addition, the patient has lower back pain showing some abnormalities on  the MRI.  I will consult Dr. _____ for this.   She will be placed on telemetry, three sets of cardiac enzymes will be done  q.8h.  Home medications will be resumed.  She will be placed on Lovenox for  DVT prophylaxis and given Tramadol if she has any pain.       SR/MEDQ  D:  07/10/2004  T:  07/10/2004  Job:  [id number]

## 2010-11-18 ENCOUNTER — Emergency Department (HOSPITAL_COMMUNITY)
Admission: EM | Admit: 2010-11-18 | Discharge: 2010-11-18 | Disposition: A | Discharge status: Home / Self Care | Payer: Medicaid Other | Attending: Emergency Medicine | Admitting: Emergency Medicine

## 2010-11-18 DIAGNOSIS — M79609 Pain in unspecified limb: Secondary | ICD-10-CM | POA: Insufficient documentation

## 2010-11-18 DIAGNOSIS — T148XXA Other injury of unspecified body region, initial encounter: Secondary | ICD-10-CM

## 2010-11-18 DIAGNOSIS — J40 Bronchitis, not specified as acute or chronic: Secondary | ICD-10-CM | POA: Insufficient documentation

## 2010-11-18 DIAGNOSIS — M545 Low back pain, unspecified: Secondary | ICD-10-CM | POA: Insufficient documentation

## 2010-11-18 DIAGNOSIS — J45909 Unspecified asthma, uncomplicated: Secondary | ICD-10-CM | POA: Insufficient documentation

## 2010-11-18 DIAGNOSIS — X58XXXA Exposure to other specified factors, initial encounter: Secondary | ICD-10-CM | POA: Insufficient documentation

## 2010-11-18 DIAGNOSIS — F17200 Nicotine dependence, unspecified, uncomplicated: Secondary | ICD-10-CM | POA: Insufficient documentation

## 2010-11-18 DIAGNOSIS — I10 Essential (primary) hypertension: Secondary | ICD-10-CM | POA: Insufficient documentation

## 2010-11-18 HISTORY — DX: Gastro-esophageal reflux disease without esophagitis: K21.9

## 2010-11-18 HISTORY — DX: Bronchitis, not specified as acute or chronic: J40

## 2010-11-18 HISTORY — DX: Essential (primary) hypertension: I10

## 2010-11-18 MED ORDER — OXYCODONE-ACETAMINOPHEN 5-325 MG PO TABS: 1.0000 | ORAL_TABLET | ORAL | Status: AC | PRN

## 2010-11-18 MED ORDER — METHOCARBAMOL 500 MG PO TABS
1000.0000 mg | ORAL_TABLET | Freq: Once | ORAL | Status: AC
  Administered 2010-11-18: 1000 mg via ORAL
  Filled 2010-11-18: qty 2

## 2010-11-18 MED ORDER — NAPROXEN 500 MG PO TABS: 500.0000 mg | ORAL_TABLET | Freq: Two times a day (BID) | ORAL | Status: DC

## 2010-11-18 MED ORDER — CYCLOBENZAPRINE HCL 10 MG PO TABS: 10.0000 mg | ORAL_TABLET | Freq: Three times a day (TID) | ORAL | Status: AC | PRN

## 2010-11-18 MED ORDER — OXYCODONE-ACETAMINOPHEN 5-325 MG PO TABS
1.0000 | ORAL_TABLET | Freq: Once | ORAL | Status: AC
  Administered 2010-11-18: 1 via ORAL
  Filled 2010-11-18: qty 1

## 2010-11-18 NOTE — ED Notes (Signed)
Pt reports severe groin pain for the past 3 days. Pt denies any injury or GU symptoms.  Pt reports difficulty walking d/t the pain.

## 2010-11-18 NOTE — ED Provider Notes (Signed)
History     CSN: [id number] Arrival date & time: 11/18/2010  8:27 AM  Chief Complaint  Patient presents with  . Hip Pain    HPI  (Consider location/radiation/quality/duration/timing/severity/associated sxs/prior treatment)  HPI Comments: patient c/o pain to the bilateral thighs and lower back for several days.  States the pain began after excessive walking on Friday.  She denies fall or trauma.  Pain is worse with walking up / down steps, or bending.  She denies numbness, weakness, incontinence, or dysuria.    Patient is a 50 y.o. female presenting with leg pain. The history is provided by the patient.  Leg Pain  The incident occurred more than 2 days ago. [place]. Injury mechanism: excessive walking. The pain is present in the right thigh and left thigh. The quality of the pain is described as aching and throbbing. The pain is moderate. The pain has been constant since onset. Pertinent negatives include no numbness, no inability to bear weight, no loss of motion, no muscle weakness, no loss of sensation and no tingling. She reports no foreign bodies present. The symptoms are aggravated by activity, bearing weight and palpation. She has tried heat for the symptoms. The treatment provided moderate relief.    Past Medical History  Diagnosis Date  . Asthma   . Bronchitis   . Hypertension   . GERD (gastroesophageal reflux disease)     Past Surgical History  Procedure Date  . Cholecystectomy   . Abdominal hysterectomy     History reviewed. No pertinent family history.  History  Substance Use Topics  . Smoking status: Current Everyday Smoker  . Smokeless tobacco: Not on file  . Alcohol Use: No    OB History    Grav Para Term Preterm Abortions TAB SAB Ect Mult Living                  Review of Systems  Review of Systems  Constitutional: Negative for activity change and appetite change.  HENT: Negative for neck pain and neck stiffness.     Genitourinary: Negative for dysuria, hematuria, flank pain, decreased urine volume, vaginal bleeding, vaginal discharge, difficulty urinating, vaginal pain and pelvic pain.  Musculoskeletal: Positive for myalgias, back pain and gait problem. Negative for joint swelling and arthralgias.  Skin: Negative.   Neurological: Negative for dizziness, tingling, weakness, numbness and headaches.  Hematological: Negative for adenopathy. Does not bruise/bleed easily.  Psychiatric/Behavioral: Negative for confusion and decreased concentration.  All other systems reviewed and are negative.    Allergies  Review of patient's allergies indicates no known allergies.  Home Medications   Current Outpatient Rx  Name Route Sig Dispense Refill  . ALBUTEROL SULFATE HFA 108 (90 BASE) MCG/ACT IN AERS Inhalation Inhale 2 puffs into the lungs every 6 (six) hours as needed. shortness of breath     . ALBUTEROL SULFATE (2.5 MG/3ML) 0.083% IN NEBU Nebulization Take 2.5 mg by nebulization every 6 (six) hours as needed. wheezing/shortness of breath     . CYCLOBENZAPRINE HCL 10 MG PO TABS Oral Take 10 mg by mouth 3 (three) times daily as needed. Muscle spasms     . FLUTICASONE-SALMETEROL 500-50 MCG/DOSE IN AEPB Inhalation Inhale 1 puff into the lungs every 12 (twelve) hours.      Marland Kitchen LISINOPRIL 20 MG PO TABS Oral Take 20 mg by mouth daily.      Marland Kitchen TIOTROPIUM BROMIDE MONOHYDRATE 18 MCG IN CAPS Inhalation Place 18 mcg into inhaler and inhale  daily.        Physical Exam    BP 134/76  Pulse 76  Temp(Src) 99 F (37.2 C) (Oral)  Resp 18  Ht 5\' 6"  (1.676 m)  Wt 165 lb (74.844 kg)  BMI 26.63 kg/m2  Physical Exam  Nursing note and vitals reviewed. Constitutional: She is oriented to person, place, and time. She appears well-developed and well-nourished. No distress.  HENT:  Head: Normocephalic and atraumatic.  Mouth/Throat: Oropharynx is clear and moist.  Neck: Normal range of motion. Neck supple.  Cardiovascular:  Normal rate, regular rhythm and normal heart sounds.   Pulmonary/Chest: Effort normal and breath sounds normal.  Abdominal: Soft. She exhibits no distension and no mass. There is no tenderness. There is no rebound and no guarding.  Musculoskeletal: She exhibits tenderness. She exhibits no edema.       Lumbar back: She exhibits tenderness and spasm. She exhibits no bony tenderness, no swelling, no edema, no pain and normal pulse.       Right upper leg: She exhibits tenderness. She exhibits no bony tenderness, no swelling, no edema, no deformity and no laceration.       Left upper leg: She exhibits tenderness. She exhibits no bony tenderness, no swelling, no edema, no deformity and no laceration.  Lymphadenopathy:    She has no cervical adenopathy.  Neurological: She is alert and oriented to person, place, and time. She has normal strength and normal reflexes. She is not disoriented. No cranial nerve deficit or sensory deficit. She exhibits normal muscle tone. Coordination normal.  Reflex Scores:      Patellar reflexes are 2+ on the right side and 2+ on the left side.      Achilles reflexes are 2+ on the right side and 2+ on the left side. Skin: Skin is warm and dry.    ED Course  Procedures (including critical care time)      MDM   9:50 AM patient has ttp of the bilateral anterior and medial thighs.  Full ROM of the hip joints.  No focal neuro deficits on exam.  DTR's nml.  She has hx of excessive walking 4 days ago when onset of pain began.  Likely muscular strain. Pain is reproduced with abduction of both legs.  No swelling, erythema or calf pain to suggest DVT.  DP pulses are intact.  Sensation also intact.  CR< 2 sec.      Pt feels improved after observation and/or treatment in ED.   Patient / Family / Caregiver understand and agree with initial ED impression and plan with expectations set for ED visit. Agrees to f/u with her PMD in [place] for recheck   Medical screening  examination/treatment/procedure(s) were performed by non-physician practitioner and as supervising physician I was immediately available for consultation/collaboration. Osvaldo Human, M.D.     Tammy L. [place], [place] 11/18/10 1002  Carleene Cooper III, MD 11/18/10 1620

## 2010-11-25 LAB — BASIC METABOLIC PANEL
BUN: 7
CO2: 29
Calcium: 8.2 — ABNORMAL LOW
Creatinine, Ser: 0.72
GFR calc Af Amer: >60

## 2010-11-25 LAB — COMPREHENSIVE METABOLIC PANEL
ALT: 116 — ABNORMAL HIGH
ALT: 63 — ABNORMAL HIGH
ALT: 93 — ABNORMAL HIGH
AST: 126 — ABNORMAL HIGH
AST: 23
AST: 37
AST: 73 — ABNORMAL HIGH
Albumin: 2.3 — ABNORMAL LOW
Albumin: 2.6 — ABNORMAL LOW
Albumin: 3.5
Alkaline Phosphatase: 229 — ABNORMAL HIGH
BUN: 16
CO2: 28
CO2: 29
CO2: 30
CO2: 30
Calcium: 8.1 — ABNORMAL LOW
Calcium: 8.3 — ABNORMAL LOW
Calcium: 8.3 — ABNORMAL LOW
Calcium: 8.8
Chloride: 104
Chloride: 107
Chloride: 99
Creatinine, Ser: 0.81
Creatinine, Ser: 0.95
GFR calc Af Amer: >60
GFR calc Af Amer: >60
GFR calc Af Amer: >60
GFR calc Af Amer: >60
GFR calc non Af Amer: >60
GFR calc non Af Amer: >60
GFR calc non Af Amer: >60
Potassium: 3.5
Sodium: 135
Sodium: 137
Sodium: 140
Total Bilirubin: 1.1
Total Bilirubin: 1.5 — ABNORMAL HIGH
Total Bilirubin: 2.7 — ABNORMAL HIGH
Total Bilirubin: 4.3 — ABNORMAL HIGH

## 2010-11-25 LAB — CBC
HCT: 27.8 — ABNORMAL LOW
HCT: 38.9
Hemoglobin: 9.7 — ABNORMAL LOW
MCHC: 34.7
MCV: 91
MCV: 91.8
Platelets: 152
Platelets: 165
Platelets: 166
RBC: 3.34 — ABNORMAL LOW
RBC: 3.68 — ABNORMAL LOW
RBC: 3.78 — ABNORMAL LOW
WBC: 4.1
WBC: 7
WBC: 7.9

## 2010-11-25 LAB — URINALYSIS, ROUTINE W REFLEX MICROSCOPIC
Glucose, UA: NEGATIVE
Glucose, UA: NEGATIVE
Hgb urine dipstick: NEGATIVE
Ketones, ur: NEGATIVE
Ketones, ur: NEGATIVE
Leukocytes, UA: NEGATIVE
Nitrite: NEGATIVE
Nitrite: NEGATIVE
Specific Gravity, Urine: 1.03
Specific Gravity, Urine: <1.005 — ABNORMAL LOW
pH: 6
pH: 7

## 2010-11-25 LAB — DIFFERENTIAL
Basophils Absolute: 0
Eosinophils Absolute: 0.3
Eosinophils Absolute: 0.4
Eosinophils Absolute: 0.5
Eosinophils Relative: 0
Eosinophils Relative: 5
Eosinophils Relative: 6 — ABNORMAL HIGH
Eosinophils Relative: 8 — ABNORMAL HIGH
Eosinophils Relative: 8 — ABNORMAL HIGH
Lymphocytes Relative: 1 — ABNORMAL LOW
Lymphocytes Relative: 22
Lymphocytes Relative: 8 — ABNORMAL LOW
Lymphs Abs: 0.1 — ABNORMAL LOW
Lymphs Abs: 0.6 — ABNORMAL LOW
Lymphs Abs: 0.8
Lymphs Abs: 1.2
Lymphs Abs: 1.3
Monocytes Absolute: 0.5
Monocytes Absolute: 0.5
Monocytes Absolute: 0.6
Neutro Abs: 11.5 — ABNORMAL HIGH

## 2010-11-25 LAB — HEPATIC FUNCTION PANEL
ALT: 47 — ABNORMAL HIGH
Alkaline Phosphatase: 158 — ABNORMAL HIGH
Bilirubin, Direct: 0.4 — ABNORMAL HIGH
Indirect Bilirubin: 0.8
Total Bilirubin: 1.2

## 2010-11-25 LAB — URINE CULTURE

## 2010-11-25 LAB — STREP A DNA PROBE: Group A Strep Probe: NEGATIVE

## 2010-11-25 LAB — GLUCOSE, CAPILLARY: Glucose-Capillary: 63 — ABNORMAL LOW

## 2010-11-25 LAB — CULTURE, BLOOD (ROUTINE X 2)
Culture: NO GROWTH
Report Status: 11282009
Report Status: 11282009

## 2010-11-25 LAB — HEPATITIS PANEL, ACUTE: Hep B C IgM: NEGATIVE

## 2010-11-25 LAB — ETHANOL: Alcohol, Ethyl (B): <5

## 2010-11-25 LAB — URINE MICROSCOPIC-ADD ON

## 2010-11-28 LAB — COMPREHENSIVE METABOLIC PANEL
Albumin: 3.6
Alkaline Phosphatase: 66
BUN: 12
Potassium: 4.1
Sodium: 143
Total Protein: 6.3

## 2010-11-28 LAB — CBC
HCT: 38.8
Platelets: 168
RDW: 14.5

## 2010-11-28 LAB — URINALYSIS, ROUTINE W REFLEX MICROSCOPIC
Glucose, UA: NEGATIVE
Hgb urine dipstick: NEGATIVE
Protein, ur: NEGATIVE

## 2011-07-13 ENCOUNTER — Emergency Department (HOSPITAL_COMMUNITY)
Admission: EM | Admit: 2011-07-13 | Discharge: 2011-07-13 | Disposition: A | Discharge status: Home / Self Care | Payer: Medicaid Other | Attending: Emergency Medicine | Admitting: Emergency Medicine

## 2011-07-13 ENCOUNTER — Encounter (HOSPITAL_COMMUNITY): Payer: Self-pay | Admitting: *Deleted

## 2011-07-13 DIAGNOSIS — M5137 Other intervertebral disc degeneration, lumbosacral region: Secondary | ICD-10-CM | POA: Insufficient documentation

## 2011-07-13 DIAGNOSIS — J45909 Unspecified asthma, uncomplicated: Secondary | ICD-10-CM | POA: Insufficient documentation

## 2011-07-13 DIAGNOSIS — M51379 Other intervertebral disc degeneration, lumbosacral region without mention of lumbar back pain or lower extremity pain: Secondary | ICD-10-CM | POA: Insufficient documentation

## 2011-07-13 DIAGNOSIS — I10 Essential (primary) hypertension: Secondary | ICD-10-CM | POA: Insufficient documentation

## 2011-07-13 DIAGNOSIS — Z79899 Other long term (current) drug therapy: Secondary | ICD-10-CM | POA: Insufficient documentation

## 2011-07-13 DIAGNOSIS — F17200 Nicotine dependence, unspecified, uncomplicated: Secondary | ICD-10-CM | POA: Insufficient documentation

## 2011-07-13 DIAGNOSIS — G8929 Other chronic pain: Secondary | ICD-10-CM

## 2011-07-13 DIAGNOSIS — K219 Gastro-esophageal reflux disease without esophagitis: Secondary | ICD-10-CM | POA: Insufficient documentation

## 2011-07-13 DIAGNOSIS — M79609 Pain in unspecified limb: Secondary | ICD-10-CM | POA: Insufficient documentation

## 2011-07-13 DIAGNOSIS — M549 Dorsalgia, unspecified: Secondary | ICD-10-CM

## 2011-07-13 DIAGNOSIS — IMO0002 Reserved for concepts with insufficient information to code with codable children: Secondary | ICD-10-CM

## 2011-07-13 LAB — DIFFERENTIAL
Eosinophils Relative: 2 % (ref 0–5)
Lymphocytes Relative: 19 % (ref 12–46)
Lymphs Abs: 1.2 10*3/uL (ref 0.7–4.0)
Monocytes Absolute: 0.5 10*3/uL (ref 0.1–1.0)
Monocytes Relative: 7 % (ref 3–12)

## 2011-07-13 LAB — URINALYSIS, ROUTINE W REFLEX MICROSCOPIC
Glucose, UA: NEGATIVE mg/dL
Hgb urine dipstick: NEGATIVE
Ketones, ur: NEGATIVE mg/dL
Protein, ur: NEGATIVE mg/dL
pH: 7 (ref 5.0–8.0)

## 2011-07-13 LAB — CBC
HCT: 40.3 % (ref 36.0–46.0)
Hemoglobin: 13.5 g/dL (ref 12.0–15.0)
MCV: 93.5 fL (ref 78.0–100.0)
RBC: 4.31 MIL/uL (ref 3.87–5.11)
RDW: 14.6 % (ref 11.5–15.5)
WBC: 6.4 10*3/uL (ref 4.0–10.5)

## 2011-07-13 LAB — BASIC METABOLIC PANEL
BUN: 19 mg/dL (ref 6–23)
CO2: 29 mEq/L (ref 19–32)
Calcium: 9.9 mg/dL (ref 8.4–10.5)
Creatinine, Ser: 0.65 mg/dL (ref 0.50–1.10)
Glucose, Bld: 105 mg/dL — ABNORMAL HIGH (ref 70–99)

## 2011-07-13 MED ORDER — HYDROMORPHONE HCL PF 2 MG/ML IJ SOLN
2.0000 mg | Freq: Once | INTRAMUSCULAR | Status: AC
  Administered 2011-07-13: 2 mg via INTRAMUSCULAR
  Filled 2011-07-13: qty 1

## 2011-07-13 MED ORDER — ONDANSETRON 8 MG PO TBDP
8.0000 mg | ORAL_TABLET | Freq: Once | ORAL | Status: AC
  Administered 2011-07-13: 8 mg via ORAL
  Filled 2011-07-13: qty 1

## 2011-07-13 NOTE — Discharge Instructions (Signed)
Back Pain, Adult Low back pain is very common. About 1 in 5 people have back pain.The cause of low back pain is rarely dangerous. The pain often gets better over time.About half of people with a sudden onset of back pain feel better in just 2 weeks. About 8 in 10 people feel better by 6 weeks.  CAUSES Some common causes of back pain include:  Strain of the muscles or ligaments supporting the spine.   Wear and tear (degeneration) of the spinal discs.   Arthritis.   Direct injury to the back.  DIAGNOSIS Most of the time, the direct cause of low back pain is not known.However, back pain can be treated effectively even when the exact cause of the pain is unknown.Answering your caregiver's questions about your overall health and symptoms is one of the most accurate ways to make sure the cause of your pain is not dangerous. If your caregiver needs more information, he or she may order lab work or imaging tests (X-rays or MRIs).However, even if imaging tests show changes in your back, this usually does not require surgery. HOME CARE INSTRUCTIONS For many people, back pain returns.Since low back pain is rarely dangerous, it is often a condition that people can learn to manageon their own.   Remain active. It is stressful on the back to sit or stand in one place. Do not sit, drive, or stand in one place for more than 30 minutes at a time. Take short walks on level surfaces as soon as pain allows.Try to increase the length of time you walk each day.   Do not stay in bed.Resting more than 1 or 2 days can delay your recovery.   Do not avoid exercise or work.Your body is made to move.It is not dangerous to be active, even though your back may hurt.Your back will likely heal faster if you return to being active before your pain is gone.   Pay attention to your body when you bend and lift. Many people have less discomfortwhen lifting if they bend their knees, keep the load close to their  bodies,and avoid twisting. Often, the most comfortable positions are those that put less stress on your recovering back.   Find a comfortable position to sleep. Use a firm mattress and lie on your side with your knees slightly bent. If you lie on your back, put a pillow under your knees.   Only take over-the-counter or prescription medicines as directed by your caregiver. Over-the-counter medicines to reduce pain and inflammation are often the most helpful.Your caregiver may prescribe muscle relaxant drugs.These medicines help dull your pain so you can more quickly return to your normal activities and healthy exercise.   Put ice on the injured area.   Put ice in a plastic bag.   Place a towel between your skin and the bag.   Leave the ice on for 15 to 20 minutes, 3 to 4 times a day for the first 2 to 3 days. After that, ice and heat may be alternated to reduce pain and spasms.   Ask your caregiver about trying back exercises and gentle massage. This may be of some benefit.   Avoid feeling anxious or stressed.Stress increases muscle tension and can worsen back pain.It is important to recognize when you are anxious or stressed and learn ways to manage it.Exercise is a great option.  SEEK MEDICAL CARE IF:  You have pain that is not relieved with rest or medicine.   You have   pain that does not improve in 1 week.   You have new symptoms.   You are generally not feeling well.  SEEK IMMEDIATE MEDICAL CARE IF:   You have pain that radiates from your back into your legs.   You develop new bowel or bladder control problems.   You have unusual weakness or numbness in your arms or legs.   You develop nausea or vomiting.   You develop abdominal pain.   You feel faint.  Document Released: 02/09/2005 Document Revised: 01/29/2011 Document Reviewed: 06/30/2010 [employer] Patient Information 2012 [employer]. 

## 2011-07-13 NOTE — ED Provider Notes (Cosign Needed)
History   This chart was scribed for Flint Melter, MD by Clarita Crane. The patient was seen in room APA04/AP[facility]04. Patient's care was started at 1033.    CSN: [id number]  Arrival date & time 07/13/11  1033   First MD Initiated Contact with Patient 07/13/11 1317      Chief Complaint  Patient presents with  . Back Pain    (Consider location/radiation/quality/duration/timing/severity/associated sxs/prior treatment) HPI [name] is a 51 y.o. female who presents to the Emergency Department complaining of constant moderate to severe pain to LLE extending from hip to ankle onset several days ago and persistent since. Reports pain is aggravated with movement. Denies fever, nausea, vomiting, decreased appetite. Patient reports she had an epidural injection administered 10 days ago by Dr. Gerilyn Pilgrim for back and lower extremity pain but has experienced minimal relief. Patient notes she attempted to set an appointment with Dr. Gerilyn Pilgrim today but was referred to ED. Patient with h/o asthma, bronchitis, HTN, GERD.   Past Medical History  Diagnosis Date  . Asthma   . Bronchitis   . Hypertension   . GERD (gastroesophageal reflux disease)     Past Surgical History  Procedure Date  . Cholecystectomy   . Abdominal hysterectomy     History reviewed. No pertinent family history.  History  Substance Use Topics  . Smoking status: Current Everyday Smoker  . Smokeless tobacco: Not on file  . Alcohol Use: No    OB History    Grav Para Term Preterm Abortions TAB SAB Ect Mult Living                  Review of Systems A complete 10 system review of systems was obtained and all systems are negative except as noted in the HPI and PMH.   Allergies  Review of patient's allergies indicates no known allergies.  Home Medications   Current Outpatient Rx  Name Route Sig Dispense Refill  . CLONAZEPAM 0.5 MG PO TABS Oral Take 0.5 mg by mouth 3 (three) times daily as needed. For anxiety    .  FLUTICASONE-SALMETEROL 500-50 MCG/DOSE IN AEPB Inhalation Inhale 1 puff into the lungs every 12 (twelve) hours.      Marland Kitchen HYDROCODONE-ACETAMINOPHEN 10-325 MG PO TABS Oral Take 1 tablet by mouth every 4 (four) hours as needed. For pain    . LISINOPRIL 20 MG PO TABS Oral Take 20 mg by mouth daily.      Marland Kitchen OMEPRAZOLE 20 MG PO CPDR Oral Take 20 mg by mouth daily.    Marland Kitchen TIOTROPIUM BROMIDE MONOHYDRATE 18 MCG IN CAPS Inhalation Place 18 mcg into inhaler and inhale daily.      . ALBUTEROL SULFATE HFA 108 (90 BASE) MCG/ACT IN AERS Inhalation Inhale 2 puffs into the lungs every 6 (six) hours as needed. shortness of breath     . ALBUTEROL SULFATE (2.5 MG/3ML) 0.083% IN NEBU Nebulization Take 2.5 mg by nebulization every 6 (six) hours as needed. wheezing/shortness of breath       BP 114/69  Pulse 94  Temp(Src) 98 F (36.7 C) (Oral)  Resp 16  Ht 5\' 6"  (1.676 m)  Wt 160 lb (72.576 kg)  BMI 25.82 kg/m2  SpO2 97%  Physical Exam  Nursing note and vitals reviewed. Constitutional: She is oriented to person, place, and time. She appears well-developed and well-nourished. No distress.  HENT:  Head: Normocephalic and atraumatic.  Eyes: Conjunctivae and EOM are normal. Pupils are equal, round, and reactive to  light.  Neck: Normal range of motion and phonation normal. Neck supple.  Cardiovascular: Normal rate, regular rhythm and intact distal pulses.  Exam reveals no gallop and no friction rub.   No murmur heard. Pulmonary/Chest: Effort normal and breath sounds normal. She has no wheezes. She has no rales.  Abdominal: Soft. She exhibits no distension. There is no tenderness. There is no guarding.  Musculoskeletal: Normal range of motion. She exhibits no tenderness.       Left lumbar spine tenderness. No palpable tenderness to LLE but pain noted to left knee and hip.   Neurological: She is alert and oriented to person, place, and time. She has normal strength. She exhibits normal muscle tone.       Lower  extremity strength normal and equal bilaterally. Plantar flexion and extension normal bilaterally.   Skin: Skin is warm and dry.  Psychiatric: She has a normal mood and affect. Her behavior is normal. Judgment and thought content normal.    ED Course  Procedures (including critical care time)  DIAGNOSTIC STUDIES: Oxygen Saturation is 99% on room air, normal by my interpretation.    COORDINATION OF CARE: 1:40PM-Patient informed of current plan for treatment and evaluation and agrees with plan at this time.   Emergency department treatment: IM Dilaudid, and oral, Zofran. Reevaluated at 17:40, improved, pain and comfortable enough to go home.  Records were obtained from her pain management doctor. Epidural injection on 07/13/11. He subsequently noted that she had persistent pain and has considered doing an MRI to evaluate it further. We were able to locate records from an MR done at [facility] on 05/28/11; it showed mild degenerative change of the lumbar spine.    Labs Reviewed  CBC - Abnormal; Notable for the following:    Platelets 147 (*)    All other components within normal limits  BASIC METABOLIC PANEL - Abnormal; Notable for the following:    Glucose, Bld 105 (*)    All other components within normal limits  URINALYSIS, ROUTINE W REFLEX MICROSCOPIC - Abnormal; Notable for the following:    Urobilinogen, UA 2.0 (*)    All other components within normal limits  SEDIMENTATION RATE - Abnormal; Notable for the following:    Sed Rate 58 (*)    All other components within normal limits  DIFFERENTIAL   No results found.   1. Back pain   2. Chronic pain   3. Degenerative disc disease       MDM  Recurrent back pain, with chronic pain, and narcotic therapy Contract. The patient is improved, and stable for discharge. I doubt discitis, meningitis, or metabolic instability. She is stable for discharge.     I personally performed the services described in this documentation,  which was scribed in my presence. The recorded information has been reviewed and considered.    Plan: Home Medications- usual; Home Treatments- heat treatments; Recommended follow up- Pain doctor to arrange OP MR Lumbar if indicated.   Flint Melter, MD 07/13/11 1751  Flint Melter, MD 07/13/11 1754

## 2011-07-13 NOTE — ED Notes (Signed)
Pain low back and lt leg . Had epidural on Friday, cont to have, pain and decreased use of lt leg,  Says she has fallen because of this.

## 2011-07-15 ENCOUNTER — Encounter (HOSPITAL_COMMUNITY): Payer: Self-pay | Admitting: *Deleted

## 2011-07-15 ENCOUNTER — Emergency Department (HOSPITAL_COMMUNITY)
Admission: EM | Admit: 2011-07-15 | Discharge: 2011-07-15 | Disposition: A | Discharge status: Home / Self Care | Payer: Medicaid Other | Attending: Emergency Medicine | Admitting: Emergency Medicine

## 2011-07-15 ENCOUNTER — Emergency Department (HOSPITAL_COMMUNITY): Payer: Medicaid Other

## 2011-07-15 DIAGNOSIS — I10 Essential (primary) hypertension: Secondary | ICD-10-CM | POA: Insufficient documentation

## 2011-07-15 DIAGNOSIS — M51379 Other intervertebral disc degeneration, lumbosacral region without mention of lumbar back pain or lower extremity pain: Secondary | ICD-10-CM | POA: Insufficient documentation

## 2011-07-15 DIAGNOSIS — R262 Difficulty in walking, not elsewhere classified: Secondary | ICD-10-CM | POA: Insufficient documentation

## 2011-07-15 DIAGNOSIS — M549 Dorsalgia, unspecified: Secondary | ICD-10-CM | POA: Insufficient documentation

## 2011-07-15 DIAGNOSIS — M545 Low back pain, unspecified: Secondary | ICD-10-CM | POA: Insufficient documentation

## 2011-07-15 DIAGNOSIS — R4789 Other speech disturbances: Secondary | ICD-10-CM | POA: Insufficient documentation

## 2011-07-15 DIAGNOSIS — M5137 Other intervertebral disc degeneration, lumbosacral region: Secondary | ICD-10-CM | POA: Insufficient documentation

## 2011-07-15 HISTORY — DX: Other chronic pain: G89.29

## 2011-07-15 HISTORY — DX: Sciatica, unspecified side: M54.30

## 2011-07-15 HISTORY — DX: Dorsalgia, unspecified: M54.9

## 2011-07-15 LAB — CBC
HCT: 37 % (ref 36.0–46.0)
Hemoglobin: 12.3 g/dL (ref 12.0–15.0)
MCH: 30.9 pg (ref 26.0–34.0)
MCHC: 33.2 g/dL (ref 30.0–36.0)
RBC: 3.98 MIL/uL (ref 3.87–5.11)

## 2011-07-15 LAB — BASIC METABOLIC PANEL
BUN: 17 mg/dL (ref 6–23)
Chloride: 97 mEq/L (ref 96–112)
Creatinine, Ser: 0.66 mg/dL (ref 0.50–1.10)
GFR calc Af Amer: >90 mL/min (ref >90)
Glucose, Bld: 95 mg/dL (ref 70–99)
Potassium: 3.7 mEq/L (ref 3.5–5.1)

## 2011-07-15 LAB — DIFFERENTIAL
Basophils Relative: 0 % (ref 0–1)
Lymphs Abs: 1.6 10*3/uL (ref 0.7–4.0)
Monocytes Absolute: 0.8 10*3/uL (ref 0.1–1.0)
Monocytes Relative: 10 % (ref 3–12)
Neutro Abs: 5.2 10*3/uL (ref 1.7–7.7)

## 2011-07-15 MED ORDER — GADOBENATE DIMEGLUMINE 529 MG/ML IV SOLN
15.0000 mL | Freq: Once | INTRAVENOUS | Status: AC | PRN
  Administered 2011-07-15: 15 mL via INTRAVENOUS

## 2011-07-15 NOTE — ED Notes (Signed)
Pt to bathroom with much assistance. Pt drags her left foot and leg. When inst to pick her leg up she states she is trying

## 2011-07-15 NOTE — ED Notes (Signed)
Pt c/o pain to the back and states her pain meds are not working. Pt states she took a lortab at 0930 and is supposed to have xrays per orders by Dr. Gerilyn Pilgrim.

## 2011-07-15 NOTE — Discharge Instructions (Signed)
MRI scan showed no evidence of spinal infection. Continue your treatment plan as prescribed by the neurologist

## 2011-07-15 NOTE — ED Provider Notes (Signed)
History   This chart was scribed for Ellen Hutching, MD by Clarita Crane. The patient was seen in room APAH2/APAH2. Patient's care was started at 1051.    CSN: [id number]  Arrival date & time 07/15/11  1051   First MD Initiated Contact with Patient 07/15/11 1200      Chief Complaint  Patient presents with  . Back Pain  . Hip Pain    (Consider location/radiation/quality/duration/timing/severity/associated sxs/prior treatment) HPI [name] is a 51 y.o. female who presents to the Emergency Department complaining of constant moderate to severe lower back pain with associated weakness to bilateral lower extremities, left worse than right. Patient notes she is unable to walk without assistance from a walker due to severe weakness of bilateral lower extremities. Patient notes she received an epidural to lower back 12 days ago by Dr. Gerilyn Pilgrim to treat back pain and symptoms began several days following. Denies numbness, tingling, fever, chills, nausea, vomiting. Patient with h/o HTN, asthma, chronic back pain, sciatic nerve pain, cholecystectomy,abdominal hysterectomy and is a current smoker.  PCP- Loney Hering  Past Medical History  Diagnosis Date  . Asthma   . Bronchitis   . Hypertension   . GERD (gastroesophageal reflux disease)   . Chronic back pain   . Sciatic nerve pain     Past Surgical History  Procedure Date  . Cholecystectomy   . Abdominal hysterectomy     History reviewed. No pertinent family history.  History  Substance Use Topics  . Smoking status: Current Everyday Smoker  . Smokeless tobacco: Not on file  . Alcohol Use: No    OB History    Grav Para Term Preterm Abortions TAB SAB Ect Mult Living                  Review of Systems A complete 10 system review of systems was obtained and all systems are negative except as noted in the HPI and PMH.   Allergies  Review of patient's allergies indicates no known allergies.  Home Medications   Current Outpatient  Rx  Name Route Sig Dispense Refill  . ALBUTEROL SULFATE HFA 108 (90 BASE) MCG/ACT IN AERS Inhalation Inhale 2 puffs into the lungs every 6 (six) hours as needed. shortness of breath     . ALBUTEROL SULFATE (2.5 MG/3ML) 0.083% IN NEBU Nebulization Take 2.5 mg by nebulization every 6 (six) hours as needed. wheezing/shortness of breath    . CLONAZEPAM 0.5 MG PO TABS Oral Take 0.5 mg by mouth 3 (three) times daily as needed. For anxiety    . FLUTICASONE-SALMETEROL 500-50 MCG/DOSE IN AEPB Inhalation Inhale 1 puff into the lungs every 12 (twelve) hours.      Marland Kitchen HYDROCODONE-ACETAMINOPHEN 10-325 MG PO TABS Oral Take 1 tablet by mouth every 4 (four) hours as needed. For pain    . OMEPRAZOLE 20 MG PO CPDR Oral Take 20 mg by mouth daily.    Marland Kitchen TIOTROPIUM BROMIDE MONOHYDRATE 18 MCG IN CAPS Inhalation Place 18 mcg into inhaler and inhale daily.      Marland Kitchen LISINOPRIL 20 MG PO TABS Oral Take 20 mg by mouth daily.        BP 125/77  Pulse 89  Temp(Src) 97.7 F (36.5 C) (Oral)  Resp 20  Ht 5\' 6"  (1.676 m)  Wt 160 lb (72.576 kg)  BMI 25.82 kg/m2  SpO2 100%  Physical Exam  Nursing note and vitals reviewed. Constitutional: She is oriented to person, place, and time. She appears well-developed and  well-nourished. No distress.  HENT:  Head: Normocephalic and atraumatic.  Eyes: EOM are normal. Pupils are equal, round, and reactive to light.  Neck: Neck supple. No tracheal deviation present.  Cardiovascular: Normal rate.   Pulmonary/Chest: Effort normal. No respiratory distress.  Abdominal: Soft. She exhibits no distension.  Musculoskeletal: Normal range of motion. She exhibits no edema.  Neurological: She is alert and oriented to person, place, and time. No sensory deficit.       Difficulty lifting LLE against gravity.   Skin: Skin is warm and dry.  Psychiatric: She has a normal mood and affect. Her behavior is normal.       Speech slurred.     ED Course  Procedures (including critical care  time)  DIAGNOSTIC STUDIES: Oxygen Saturation is 100% on room air, normal by my interpretation.    COORDINATION OF CARE: 12:17PM- Patient informed of current plan for treatment and evaluation and agrees with plan at this time.     Labs Reviewed  CBC - Abnormal; Notable for the following:    Platelets 133 (*)    All other components within normal limits  DIFFERENTIAL  BASIC METABOLIC PANEL   Mr Lumbar Spine W Wo Contrast  07/15/2011  *RADIOLOGY REPORT*  Clinical Data: Low back pain with difficulty walking.  Recent spinal injection.  Rule out epidural abscess.  MRI LUMBAR SPINE WITHOUT AND WITH CONTRAST  Technique:  Multiplanar and multiecho pulse sequences of the lumbar spine were obtained without and with intravenous contrast.  Contrast: 15mL MULTIHANCE GADOBENATE DIMEGLUMINE 529 MG/ML IV SOLN  Comparison: Lumbar MRI 07/01/2004  Findings: No evidence of infection or epidural abscess.  Negative for diskitis.  No bone marrow edema or abnormal enhancement is identified.  Negative for fracture or mass.  Conus medullaris is normal and terminates at L1-2.  T12-L1:  Small left-sided disc and osteophyte.  L1-2:  Mild disc degeneration and disc bulging without stenosis.  L2-3:  Disc degeneration with disc bulging.  Mild facet degeneration.  L3-4:  Mild disc bulging and mild vertebral spurring without significant stenosis.  L4-5:  Mild disc bulging and spondylosis.  Bilateral facet hypertrophy.  Mild foraminal stenosis bilaterally.  L5-S1:  Mild disc bulging and mild facet degeneration.  Foraminal encroachment is present bilaterally.  IMPRESSION: No evidence of spinal infection or epidural abscess.  Negative for fracture.  Lumbar degenerative changes as above.  There is foraminal narrowing bilaterally at L4-5 and L5-S1 due to disc bulging and facet hypertrophy.  No significant central canal stenosis.  Original Report Authenticated By: Camelia Phenes, M.D.     No diagnosis found.    MDM  MRI shows no  evidence of spinal infection or epidural abscess.  No bowel or bladder incontinence.     I personally performed the services described in this documentation, which was scribed in my presence. The recorded information has been reviewed and considered.    Ellen Hutching, MD 07/15/11 1537

## 2011-07-15 NOTE — ED Notes (Signed)
Pt moved to hallway 2. Quiet, resting with eyes closed. Waiting to go over for mri

## 2011-07-15 NOTE — ED Notes (Signed)
Pt her from [facility]. Pt states she has not been able to walk since she had a epidural 5/10 at dr donquah's office. Office called by nurse. States pt received a caudal injection on 5/10. Pt unable to have out pt mri because she had one a few months ago.

## 2011-07-15 NOTE — ED Notes (Signed)
Pt presents to er with continued lower back pain, left hip and bilateral leg pain, pt states that she was seen in er on 07/13/2011, was discharged home, to follow up with Dr. Ronal Fear office, pt was seen in his office yesterday, is suppose to have outpatient xrays done which pt has order for, was given lortab 10/325mg , pt states that the pain is not any better with the lortab, pt states that she thought she was suppose to come to the er for the xrays, explained to pt that the order was for the radiology department, pt states that she wants to be seen in er due to pain.

## 2011-07-15 NOTE — ED Notes (Signed)
Pt states that she last took her pain medication at 9:30 this am , one tablet

## 2011-07-28 ENCOUNTER — Encounter (HOSPITAL_COMMUNITY): Payer: Self-pay | Admitting: *Deleted

## 2011-07-28 ENCOUNTER — Other Ambulatory Visit: Payer: Self-pay | Admitting: Physician Assistant

## 2011-07-28 ENCOUNTER — Encounter (HOSPITAL_COMMUNITY): Payer: Self-pay | Admitting: Pharmacy Technician

## 2011-07-28 NOTE — Pre-Procedure Instructions (Signed)
20 [name]  07/28/2011   Your procedure is scheduled on:  Friday June 7  Call [facility] at 8:00 AM to be given arrival time.  Call this number if you have problems the morning of surgery: [phone]   Remember:   Do not eat food:After Midnight.  May have clear liquids: up to 4 Hours before arrival.  Clear liquids include soda, tea, black coffee, apple or grape juice, broth.  Take these medicines the morning of surgery with A SIP OF WATER: Albuterol nebs, Advair, Spiriva, Norco, Omeprazole. Bring Albuterol inhaler day of surgery.     Do not wear jewelry, make-up or nail polish.  Do not wear lotions, powders, or perfumes. You may wear deodorant.  Do not shave 48 hours prior to surgery. Men may shave face and neck.  Do not bring valuables to the hospital.  Contacts, dentures or bridgework may not be worn into surgery.  Leave suitcase in the car. After surgery it may be brought to your room.  For patients admitted to the hospital, checkout time is 11:00 AM the day of discharge.   Patients discharged the day of surgery will not be allowed to drive home.  Name and phone number of your driver: NA  Special Instructions: CHG Shower Use Special Wash: 1/2 bottle night before surgery and 1/2 bottle morning of surgery.   Please read over the following fact sheets that you were given: Pain Booklet, Coughing and Deep Breathing, Blood Transfusion Information, Total Joint Packet and Surgical Site Infection Prevention

## 2011-07-29 ENCOUNTER — Encounter (HOSPITAL_COMMUNITY): Payer: Self-pay | Admitting: General Practice

## 2011-07-29 ENCOUNTER — Inpatient Hospital Stay (HOSPITAL_COMMUNITY): Admission: RE | Admit: 2011-07-29 | Discharge: 2011-07-29 | Payer: Medicaid Other | Source: Ambulatory Visit

## 2011-07-29 ENCOUNTER — Inpatient Hospital Stay (HOSPITAL_COMMUNITY): Payer: Medicaid Other

## 2011-07-29 ENCOUNTER — Inpatient Hospital Stay (HOSPITAL_COMMUNITY)
Admission: RE | Admit: 2011-07-29 | Discharge: 2011-08-03 | DRG: 470 | Disposition: A | Discharge status: Home Health | Payer: Medicaid Other | Source: Ambulatory Visit | Attending: Internal Medicine | Admitting: Internal Medicine

## 2011-07-29 DIAGNOSIS — F329 Major depressive disorder, single episode, unspecified: Secondary | ICD-10-CM | POA: Diagnosis present

## 2011-07-29 DIAGNOSIS — S72009A Fracture of unspecified part of neck of unspecified femur, initial encounter for closed fracture: Principal | ICD-10-CM

## 2011-07-29 DIAGNOSIS — J45909 Unspecified asthma, uncomplicated: Secondary | ICD-10-CM

## 2011-07-29 DIAGNOSIS — J449 Chronic obstructive pulmonary disease, unspecified: Secondary | ICD-10-CM | POA: Diagnosis present

## 2011-07-29 DIAGNOSIS — E876 Hypokalemia: Secondary | ICD-10-CM | POA: Diagnosis not present

## 2011-07-29 DIAGNOSIS — Z96649 Presence of unspecified artificial hip joint: Secondary | ICD-10-CM

## 2011-07-29 DIAGNOSIS — I10 Essential (primary) hypertension: Secondary | ICD-10-CM | POA: Diagnosis present

## 2011-07-29 DIAGNOSIS — Z9981 Dependence on supplemental oxygen: Secondary | ICD-10-CM

## 2011-07-29 DIAGNOSIS — M129 Arthropathy, unspecified: Secondary | ICD-10-CM | POA: Diagnosis present

## 2011-07-29 DIAGNOSIS — D62 Acute posthemorrhagic anemia: Secondary | ICD-10-CM | POA: Diagnosis not present

## 2011-07-29 DIAGNOSIS — M549 Dorsalgia, unspecified: Secondary | ICD-10-CM | POA: Diagnosis present

## 2011-07-29 DIAGNOSIS — G8929 Other chronic pain: Secondary | ICD-10-CM | POA: Diagnosis present

## 2011-07-29 DIAGNOSIS — F3289 Other specified depressive episodes: Secondary | ICD-10-CM | POA: Diagnosis present

## 2011-07-29 DIAGNOSIS — G4730 Sleep apnea, unspecified: Secondary | ICD-10-CM | POA: Diagnosis present

## 2011-07-29 DIAGNOSIS — M5430 Sciatica, unspecified side: Secondary | ICD-10-CM | POA: Diagnosis present

## 2011-07-29 DIAGNOSIS — F17200 Nicotine dependence, unspecified, uncomplicated: Secondary | ICD-10-CM | POA: Diagnosis present

## 2011-07-29 DIAGNOSIS — F411 Generalized anxiety disorder: Secondary | ICD-10-CM | POA: Diagnosis present

## 2011-07-29 DIAGNOSIS — K219 Gastro-esophageal reflux disease without esophagitis: Secondary | ICD-10-CM

## 2011-07-29 DIAGNOSIS — D649 Anemia, unspecified: Secondary | ICD-10-CM | POA: Diagnosis present

## 2011-07-29 DIAGNOSIS — Z90710 Acquired absence of both cervix and uterus: Secondary | ICD-10-CM

## 2011-07-29 DIAGNOSIS — Y929 Unspecified place or not applicable: Secondary | ICD-10-CM

## 2011-07-29 DIAGNOSIS — D696 Thrombocytopenia, unspecified: Secondary | ICD-10-CM | POA: Diagnosis present

## 2011-07-29 DIAGNOSIS — J4489 Other specified chronic obstructive pulmonary disease: Secondary | ICD-10-CM | POA: Diagnosis present

## 2011-07-29 DIAGNOSIS — Z9089 Acquired absence of other organs: Secondary | ICD-10-CM

## 2011-07-29 DIAGNOSIS — W19XXXA Unspecified fall, initial encounter: Secondary | ICD-10-CM | POA: Diagnosis present

## 2011-07-29 HISTORY — DX: Sleep apnea, unspecified: G47.30

## 2011-07-29 HISTORY — DX: Chronic obstructive pulmonary disease, unspecified: J44.9

## 2011-07-29 HISTORY — DX: Headache: R51

## 2011-07-29 HISTORY — DX: Anxiety disorder, unspecified: F41.9

## 2011-07-29 HISTORY — DX: Shortness of breath: R06.02

## 2011-07-29 HISTORY — DX: Unspecified osteoarthritis, unspecified site: M19.90

## 2011-07-29 LAB — PROTIME-INR
INR: 1.14 (ref 0.00–1.49)
Prothrombin Time: 14.8 seconds (ref 11.6–15.2)

## 2011-07-29 LAB — COMPREHENSIVE METABOLIC PANEL
Albumin: 3.4 g/dL — ABNORMAL LOW (ref 3.5–5.2)
BUN: 13 mg/dL (ref 6–23)
Creatinine, Ser: 0.64 mg/dL (ref 0.50–1.10)
GFR calc Af Amer: >90 mL/min (ref >90)
Glucose, Bld: 154 mg/dL — ABNORMAL HIGH (ref 70–99)
Total Bilirubin: 0.4 mg/dL (ref 0.3–1.2)
Total Protein: 6.9 g/dL (ref 6.0–8.3)

## 2011-07-29 LAB — URINALYSIS, ROUTINE W REFLEX MICROSCOPIC
Glucose, UA: NEGATIVE mg/dL
Hgb urine dipstick: NEGATIVE
Leukocytes, UA: NEGATIVE
Protein, ur: NEGATIVE mg/dL
Specific Gravity, Urine: 1.018 (ref 1.005–1.030)

## 2011-07-29 LAB — DIFFERENTIAL
Basophils Relative: 0 % (ref 0–1)
Monocytes Relative: 4 % (ref 3–12)
Neutro Abs: 3.9 10*3/uL (ref 1.7–7.7)
Neutrophils Relative %: 72 % (ref 43–77)

## 2011-07-29 LAB — CBC
HCT: 38.1 % (ref 36.0–46.0)
MCHC: 33.3 g/dL (ref 30.0–36.0)
MCV: 95 fL (ref 78.0–100.0)
RDW: 15.5 % (ref 11.5–15.5)

## 2011-07-29 LAB — TSH: TSH: 0.338 u[IU]/mL — ABNORMAL LOW (ref 0.350–4.500)

## 2011-07-29 LAB — CARDIAC PANEL(CRET KIN+CKTOT+MB+TROPI): Troponin I: <0.3 ng/mL (ref <0.30)

## 2011-07-29 MED ORDER — SENNA 8.6 MG PO TABS
1.0000 | ORAL_TABLET | Freq: Two times a day (BID) | ORAL | Status: DC
  Administered 2011-07-29 – 2011-08-03 (×9): 8.6 mg via ORAL
  Filled 2011-07-29 (×13): qty 1

## 2011-07-29 MED ORDER — CLONAZEPAM 0.5 MG PO TABS
0.5000 mg | ORAL_TABLET | Freq: Three times a day (TID) | ORAL | Status: DC
  Administered 2011-07-29 – 2011-08-03 (×13): 0.5 mg via ORAL
  Filled 2011-07-29 (×13): qty 1

## 2011-07-29 MED ORDER — PANTOPRAZOLE SODIUM 40 MG PO TBEC
40.0000 mg | DELAYED_RELEASE_TABLET | Freq: Every day | ORAL | Status: DC
  Administered 2011-07-30 – 2011-08-03 (×2): 40 mg via ORAL
  Filled 2011-07-29 (×2): qty 1

## 2011-07-29 MED ORDER — ONDANSETRON HCL 4 MG/2ML IJ SOLN: 4.0000 mg | Freq: Four times a day (QID) | INTRAMUSCULAR | Status: DC | PRN

## 2011-07-29 MED ORDER — SODIUM CHLORIDE 0.9 % IJ SOLN
3.0000 mL | Freq: Two times a day (BID) | INTRAMUSCULAR | Status: DC
  Administered 2011-07-29 – 2011-07-30 (×3): 3 mL via INTRAVENOUS
  Administered 2011-07-31: 22:00:00 via INTRAVENOUS
  Administered 2011-08-03: 3 mL via INTRAVENOUS

## 2011-07-29 MED ORDER — TIOTROPIUM BROMIDE MONOHYDRATE 18 MCG IN CAPS
18.0000 ug | ORAL_CAPSULE | Freq: Every day | RESPIRATORY_TRACT | Status: DC
  Filled 2011-07-29: qty 5

## 2011-07-29 MED ORDER — IPRATROPIUM BROMIDE 0.02 % IN SOLN
0.5000 mg | Freq: Four times a day (QID) | RESPIRATORY_TRACT | Status: DC
  Administered 2011-07-29 – 2011-07-30 (×3): 0.5 mg via RESPIRATORY_TRACT
  Filled 2011-07-29 (×3): qty 2.5

## 2011-07-29 MED ORDER — DOCUSATE SODIUM 100 MG PO CAPS
100.0000 mg | ORAL_CAPSULE | Freq: Two times a day (BID) | ORAL | Status: DC
  Administered 2011-07-29 – 2011-08-03 (×10): 100 mg via ORAL
  Filled 2011-07-29 (×13): qty 1

## 2011-07-29 MED ORDER — ENOXAPARIN SODIUM 40 MG/0.4ML ~~LOC~~ SOLN
40.0000 mg | SUBCUTANEOUS | Status: DC
  Administered 2011-07-29 – 2011-07-30 (×2): 40 mg via SUBCUTANEOUS
  Filled 2011-07-29 (×3): qty 0.4

## 2011-07-29 MED ORDER — ALUM & MAG HYDROXIDE-SIMETH 200-200-20 MG/5ML PO SUSP: 30.0000 mL | Freq: Four times a day (QID) | ORAL | Status: DC | PRN

## 2011-07-29 MED ORDER — ASPIRIN EC 81 MG PO TBEC
81.0000 mg | DELAYED_RELEASE_TABLET | Freq: Every day | ORAL | Status: DC
  Administered 2011-07-29 – 2011-07-31 (×3): 81 mg via ORAL
  Filled 2011-07-29 (×3): qty 1

## 2011-07-29 MED ORDER — ACETAMINOPHEN 650 MG RE SUPP: 650.0000 mg | Freq: Four times a day (QID) | RECTAL | Status: DC | PRN

## 2011-07-29 MED ORDER — FLUTICASONE-SALMETEROL 500-50 MCG/DOSE IN AEPB
1.0000 | INHALATION_SPRAY | Freq: Two times a day (BID) | RESPIRATORY_TRACT | Status: DC
  Administered 2011-07-30 – 2011-08-03 (×8): 1 via RESPIRATORY_TRACT
  Filled 2011-07-29 (×3): qty 14

## 2011-07-29 MED ORDER — ALBUTEROL SULFATE (5 MG/ML) 0.5% IN NEBU: 2.5000 mg | INHALATION_SOLUTION | RESPIRATORY_TRACT | Status: DC | PRN

## 2011-07-29 MED ORDER — OXYCODONE HCL 5 MG PO TABS
5.0000 mg | ORAL_TABLET | ORAL | Status: DC | PRN
  Administered 2011-07-29 – 2011-07-31 (×5): 5 mg via ORAL
  Filled 2011-07-29 (×7): qty 1

## 2011-07-29 MED ORDER — ACETAMINOPHEN 325 MG PO TABS
650.0000 mg | ORAL_TABLET | Freq: Four times a day (QID) | ORAL | Status: DC | PRN
  Administered 2011-08-02: 650 mg via ORAL
  Filled 2011-07-29: qty 2

## 2011-07-29 MED ORDER — ONDANSETRON HCL 4 MG PO TABS: 4.0000 mg | ORAL_TABLET | Freq: Four times a day (QID) | ORAL | Status: DC | PRN

## 2011-07-29 MED ORDER — PNEUMOCOCCAL VAC POLYVALENT 25 MCG/0.5ML IJ INJ
0.5000 mL | INJECTION | Freq: Once | INTRAMUSCULAR | Status: DC
  Filled 2011-07-29: qty 0.5

## 2011-07-29 MED ORDER — SODIUM CHLORIDE 0.9 % IV SOLN
INTRAVENOUS | Status: DC
  Administered 2011-07-29: 19:00:00 via INTRAVENOUS

## 2011-07-29 NOTE — Progress Notes (Signed)
Nurse called pt in reference to scheduled pre admit appointment. Pt stated "I'm sorry I can't make it because I am being admitted to the hospital today." Will notify secretary of this.

## 2011-07-29 NOTE — H&P (Signed)
[name] MRN: [id number] DOB/AGE: 1960/07/18 51 y.o. Primary Care Physician:BLUTH, Lyda Perone, MD, MD Admit date: 07/29/2011 Chief Complaint: Back Pain   HPI: 51 y.o. female who was admitted directly from the office of Dr Madelon Lips  because of right hip pain. She was found to have a right hip fracture according to Dr. Madelon Lips is physician assistant. She is still complaining of constant moderate to severe pain to LLE extending from hip to ankle onset several days ago and persistent since. Reports pain is aggravated with movement. Denies fever, nausea, vomiting, decreased appetite. Patient reports she had an epidural injection administered 20 days ago by Dr. Gerilyn Pilgrim for back and lower extremity pain but has experienced minimal relief. Patient notes she attempted to set an appointment with Dr. Gerilyn Pilgrim today but was referred to ED. subsequently she was referred to Dr. Madelon Lips because of inability to walk. Plain films as well as an MRI revealed the hip fracture. Patient with h/o asthma, bronchitis, HTN, GERD. She was referred to the hospitalist service for admission for medical clearance prior to her surgery on Friday   Past Medical History  Diagnosis Date  . Asthma   . Bronchitis   . Hypertension   . GERD (gastroesophageal reflux disease)   . Chronic back pain   . Sciatic nerve pain   . Shortness of breath   . Sleep apnea     stops breathing at night sometimes, uses o2 at 2L HS  . Arthritis   . Anxiety   . COPD (chronic obstructive pulmonary disease)   . Headache     Past Surgical History  Procedure Date  . Cholecystectomy   . Abdominal hysterectomy   . Tubal ligation     Prior to Admission medications   Medication Sig Start Date End Date Taking? Authorizing Provider  albuterol (PROVENTIL HFA;VENTOLIN HFA) 108 (90 BASE) MCG/ACT inhaler Inhale 2 puffs into the lungs every 6 (six) hours as needed. shortness of breath    Yes Historical Provider, MD  albuterol (PROVENTIL) (2.5 MG/3ML)  0.083% nebulizer solution Take 2.5 mg by nebulization every 6 (six) hours as needed. wheezing/shortness of breath   Yes Historical Provider, MD  aspirin EC 81 MG tablet Take 81 mg by mouth daily.   Yes Historical Provider, MD  clonazePAM (KLONOPIN) 0.5 MG tablet Take 0.5 mg by mouth 3 (three) times daily.    Yes Historical Provider, MD  Fluticasone-Salmeterol (ADVAIR) 500-50 MCG/DOSE AEPB Inhale 1 puff into the lungs every 12 (twelve) hours.     Yes Historical Provider, MD  HYDROcodone-acetaminophen (NORCO) 10-325 MG per tablet Take 1 tablet by mouth every 4 (four) hours as needed. For pain   Yes Historical Provider, MD  lisinopril (PRINIVIL,ZESTRIL) 20 MG tablet Take 20 mg by mouth daily.     Yes Historical Provider, MD  omeprazole (PRILOSEC) 20 MG capsule Take 20 mg by mouth daily.   Yes Historical Provider, MD  predniSONE (DELTASONE) 20 MG tablet Take 20 mg by mouth See admin instructions. Take 3 tabs daily x1 week, 2 tabs 3 times daily, 1 tab 3 times daily, 1/2 tab x3 days then 1/2 tab every other day until gone per patient   Yes Historical Provider, MD  tiotropium (SPIRIVA) 18 MCG inhalation capsule Place 18 mcg into inhaler and inhale daily.     Yes Historical Provider, MD    Allergies: No Known Allergies  History reviewed. No pertinent family history.  Social History:  reports that she has been smoking Cigarettes.  She has  a 16.5 pack-year smoking history. She has never used smokeless tobacco. She reports that she does not drink alcohol or use illicit drugs.         ROS:*A complete 10 system review of systems was obtained and all systems are negative except as noted in the HPI and PMH  PHYSICAL EXAM: Blood pressure 121/80, pulse 61, temperature 98 F (36.7 C), temperature source Oral, resp. rate 18, SpO2 100.00%. Nursing note and vitals reviewed.  Constitutional: She is oriented to person, place, and time. She appears well-developed and well-nourished. No distress.  HENT:    Head: Normocephalic and atraumatic.  Eyes: Conjunctivae and EOM are normal. Pupils are equal, round, and reactive to light.  Neck: Normal range of motion and phonation normal. Neck supple.  Cardiovascular: Normal rate, regular rhythm and intact distal pulses. Exam reveals no gallop and no friction rub.  No murmur heard.  Pulmonary/Chest: Effort normal and breath sounds normal. She has no wheezes. She has no rales.  Abdominal: Soft. She exhibits no distension. There is no tenderness. There is no guarding.  Musculoskeletal: Normal range of motion. She exhibits no tenderness.  Left lumbar spine tenderness. No palpable tenderness to LLE but pain noted to left knee and hip.  Neurological: She is alert and oriented to person, place, and time. She has normal strength. She exhibits normal muscle tone.  Lower extremity strength normal and equal bilaterally. Plantar flexion and extension normal bilaterally.  Skin: Skin is warm and dry.  Psychiatric: She has a normal mood and affect. Her behavior is normal. Judgment and thought content normal.     No results found for this or any previous visit (from the past 240 hour(s)).   Results for orders placed during the hospital encounter of 07/29/11 (from the past 48 hour(s))  CBC     Status: Abnormal   Collection Time   07/29/11  5:10 PM      Component Value Range Comment   WBC 5.5  4.0 - 10.5 (K/uL)    RBC 4.01  3.87 - 5.11 (MIL/uL)    Hemoglobin 12.7  12.0 - 15.0 (g/dL)    HCT 16.1  09.6 - 04.5 (%)    MCV 95.0  78.0 - 100.0 (fL)    MCH 31.7  26.0 - 34.0 (pg)    MCHC 33.3  30.0 - 36.0 (g/dL)    RDW 40.9  81.1 - 91.4 (%)    Platelets 92 (*) 150 - 400 (K/uL) PLATELET COUNT CONFIRMED BY SMEAR  COMPREHENSIVE METABOLIC PANEL     Status: Abnormal   Collection Time   07/29/11  5:10 PM      Component Value Range Comment   Sodium 140  135 - 145 (mEq/L)    Potassium 3.5  3.5 - 5.1 (mEq/L)    Chloride 103  96 - 112 (mEq/L)    CO2 28  19 - 32 (mEq/L)     Glucose, Bld 154 (*) 70 - 99 (mg/dL)    BUN 13  6 - 23 (mg/dL)    Creatinine, Ser 7.82  0.50 - 1.10 (mg/dL)    Calcium 9.3  8.4 - 10.5 (mg/dL)    Total Protein 6.9  6.0 - 8.3 (g/dL)    Albumin 3.4 (*) 3.5 - 5.2 (g/dL)    AST 31  0 - 37 (U/L)    ALT 48 (*) 0 - 35 (U/L)    Alkaline Phosphatase 139 (*) 39 - 117 (U/L)    Total Bilirubin 0.4  0.3 -  1.2 (mg/dL)    GFR calc non Af Amer >90  >90 (mL/min)    GFR calc Af Amer >90  >90 (mL/min)   DIFFERENTIAL     Status: Normal   Collection Time   07/29/11  5:10 PM      Component Value Range Comment   Neutrophils Relative 72  43 - 77 (%)    Neutro Abs 3.9  1.7 - 7.7 (K/uL)    Lymphocytes Relative 24  12 - 46 (%)    Lymphs Abs 1.3  0.7 - 4.0 (K/uL)    Monocytes Relative 4  3 - 12 (%)    Monocytes Absolute 0.2  0.1 - 1.0 (K/uL)    Eosinophils Relative 1  0 - 5 (%)    Eosinophils Absolute 0.0  0.0 - 0.7 (K/uL)    Basophils Relative 0  0 - 1 (%)    Basophils Absolute 0.0  0.0 - 0.1 (K/uL)   PROTIME-INR     Status: Normal   Collection Time   07/29/11  5:10 PM      Component Value Range Comment   Prothrombin Time 14.8  11.6 - 15.2 (seconds)    INR 1.14  0.00 - 1.49    CARDIAC PANEL(CRET KIN+CKTOT+MB+TROPI)     Status: Normal   Collection Time   07/29/11  5:11 PM      Component Value Range Comment   Total CK 51  7 - 177 (U/L)    CK, MB 2.4  0.3 - 4.0 (ng/mL)    Troponin I <0.30  <0.30 (ng/mL)    Relative Index RELATIVE INDEX IS INVALID  0.0 - 2.5    URINALYSIS, ROUTINE W REFLEX MICROSCOPIC     Status: Normal   Collection Time   07/29/11  6:33 PM      Component Value Range Comment   Color, Urine YELLOW  YELLOW     APPearance CLEAR  CLEAR     Specific Gravity, Urine 1.018  1.005 - 1.030     pH 6.5  5.0 - 8.0     Glucose, UA NEGATIVE  NEGATIVE (mg/dL)    Hgb urine dipstick NEGATIVE  NEGATIVE     Bilirubin Urine NEGATIVE  NEGATIVE     Ketones, ur NEGATIVE  NEGATIVE (mg/dL)    Protein, ur NEGATIVE  NEGATIVE (mg/dL)    Urobilinogen, UA 1.0  0.0  - 1.0 (mg/dL)    Nitrite NEGATIVE  NEGATIVE     Leukocytes, UA NEGATIVE  NEGATIVE  MICROSCOPIC NOT DONE ON URINES WITH NEGATIVE PROTEIN, BLOOD, LEUKOCYTES, NITRITE, OR GLUCOSE <1000 mg/dL.    Mr Lumbar Spine W Wo Contrast  07/15/2011  *RADIOLOGY REPORT*  Clinical Data: Low back pain with difficulty walking.  Recent spinal injection.  Rule out epidural abscess.  MRI LUMBAR SPINE WITHOUT AND WITH CONTRAST  Technique:  Multiplanar and multiecho pulse sequences of the lumbar spine were obtained without and with intravenous contrast.  Contrast: 15mL MULTIHANCE GADOBENATE DIMEGLUMINE 529 MG/ML IV SOLN  Comparison: Lumbar MRI 07/01/2004  Findings: No evidence of infection or epidural abscess.  Negative for diskitis.  No bone marrow edema or abnormal enhancement is identified.  Negative for fracture or mass.  Conus medullaris is normal and terminates at L1-2.  T12-L1:  Small left-sided disc and osteophyte.  L1-2:  Mild disc degeneration and disc bulging without stenosis.  L2-3:  Disc degeneration with disc bulging.  Mild facet degeneration.  L3-4:  Mild disc bulging and mild vertebral spurring without significant stenosis.  L4-5:  Mild disc bulging and spondylosis.  Bilateral facet hypertrophy.  Mild foraminal stenosis bilaterally.  L5-S1:  Mild disc bulging and mild facet degeneration.  Foraminal encroachment is present bilaterally.  IMPRESSION: No evidence of spinal infection or epidural abscess.  Negative for fracture.  Lumbar degenerative changes as above.  There is foraminal narrowing bilaterally at L4-5 and L5-S1 due to disc bulging and facet hypertrophy.  No significant central canal stenosis.  Original Report Authenticated By: Camelia Phenes, M.D.   Dg Chest Port 1 View  07/29/2011  *RADIOLOGY REPORT*  Clinical Data: Preop  PORTABLE CHEST - 1 VIEW  Comparison: CT chest of 01/09/2009 and portable chest x-ray of 01/09/2009  Findings: The lungs are clear.  Mediastinal contours appear normal. The heart is within  normal limits in size.  No bony abnormality is seen.  IMPRESSION: No active lung disease.  Original Report Authenticated By: Juline Patch, M.D.    Impression:      #1 right hip fracture. The patient will be admitted for surgery and preoperative clearance will include EKG, 2-D echo, telemetry monitoring and cardiac enzymes. She is a smoker which is a major risk factor for her. She had a stress test 7 years ago which was apparently negative she denies any chest pain any shortness of breath or any cardiopulmonary symptoms at this point #2 asthma the patient will be continued on her nebulizer treatments #3 hypertension currently controlled Will continue ACE inhibitor #4 gastroesophageal reflux disease continue with Protonix  Full code    [facility] 07/29/2011, 7:45 PM

## 2011-07-30 ENCOUNTER — Inpatient Hospital Stay (HOSPITAL_COMMUNITY): Payer: Medicaid Other

## 2011-07-30 DIAGNOSIS — S72009A Fracture of unspecified part of neck of unspecified femur, initial encounter for closed fracture: Secondary | ICD-10-CM

## 2011-07-30 DIAGNOSIS — K219 Gastro-esophageal reflux disease without esophagitis: Secondary | ICD-10-CM

## 2011-07-30 DIAGNOSIS — Z96649 Presence of unspecified artificial hip joint: Secondary | ICD-10-CM

## 2011-07-30 DIAGNOSIS — J45909 Unspecified asthma, uncomplicated: Secondary | ICD-10-CM

## 2011-07-30 LAB — CBC
HCT: 36.3 % (ref 36.0–46.0)
HCT: 36.5 % (ref 36.0–46.0)
MCH: 31.6 pg (ref 26.0–34.0)
MCHC: 32.8 g/dL (ref 30.0–36.0)
MCV: 97.1 fL (ref 78.0–100.0)
Platelets: 99 10*3/uL — ABNORMAL LOW (ref 150–400)
Platelets: 96 10*3/uL — ABNORMAL LOW (ref 150–400)
RBC: 3.76 MIL/uL — ABNORMAL LOW (ref 3.87–5.11)
RDW: 15.8 % — ABNORMAL HIGH (ref 11.5–15.5)
WBC: 5.3 10*3/uL (ref 4.0–10.5)
WBC: 4.7 10*3/uL (ref 4.0–10.5)

## 2011-07-30 LAB — CARDIAC PANEL(CRET KIN+CKTOT+MB+TROPI)
CK, MB: 2.2 ng/mL (ref 0.3–4.0)
CK, MB: 3.6 ng/mL (ref 0.3–4.0)
Relative Index: INVALID (ref 0.0–2.5)
Relative Index: INVALID (ref 0.0–2.5)
Total CK: 77 U/L (ref 7–177)
Troponin I: <0.3 ng/mL (ref <0.30)
Troponin I: <0.3 ng/mL (ref <0.30)

## 2011-07-30 LAB — COMPREHENSIVE METABOLIC PANEL
BUN: 13 mg/dL (ref 6–23)
CO2: 29 mEq/L (ref 19–32)
Calcium: 9 mg/dL (ref 8.4–10.5)
Chloride: 105 mEq/L (ref 96–112)
Creatinine, Ser: 0.7 mg/dL (ref 0.50–1.10)
GFR calc Af Amer: >90 mL/min (ref >90)
GFR calc non Af Amer: >90 mL/min (ref >90)
Glucose, Bld: 109 mg/dL — ABNORMAL HIGH (ref 70–99)
Total Bilirubin: 0.4 mg/dL (ref 0.3–1.2)

## 2011-07-30 LAB — BASIC METABOLIC PANEL
Chloride: 106 mEq/L (ref 96–112)
GFR calc Af Amer: >90 mL/min (ref >90)
GFR calc non Af Amer: >90 mL/min (ref >90)
Potassium: 3.5 mEq/L (ref 3.5–5.1)
Sodium: 142 mEq/L (ref 135–145)

## 2011-07-30 LAB — SURGICAL PCR SCREEN
MRSA, PCR: NEGATIVE
Staphylococcus aureus: NEGATIVE

## 2011-07-30 LAB — PROTIME-INR
INR: 1.1 (ref 0.00–1.49)
Prothrombin Time: 14.4 seconds (ref 11.6–15.2)

## 2011-07-30 LAB — HEMOGLOBIN A1C
Hgb A1c MFr Bld: 5.4 % (ref <5.7)
Mean Plasma Glucose: 108 mg/dL (ref <117)

## 2011-07-30 LAB — DIFFERENTIAL
Basophils Absolute: 0 10*3/uL (ref 0.0–0.1)
Basophils Relative: 0 % (ref 0–1)
Lymphocytes Relative: 33 % (ref 12–46)
Monocytes Absolute: 0.2 10*3/uL (ref 0.1–1.0)
Neutro Abs: 2.9 10*3/uL (ref 1.7–7.7)
Neutrophils Relative %: 62 % (ref 43–77)

## 2011-07-30 MED ORDER — HYDROCODONE-ACETAMINOPHEN 10-325 MG PO TABS
1.0000 | ORAL_TABLET | Freq: Four times a day (QID) | ORAL | Status: DC | PRN
  Administered 2011-07-30 (×4): 1 via ORAL
  Filled 2011-07-30 (×4): qty 1

## 2011-07-30 MED ORDER — FENTANYL CITRATE 0.05 MG/ML IJ SOLN: 50.0000 ug | INTRAMUSCULAR | Status: DC | PRN

## 2011-07-30 MED ORDER — SODIUM CHLORIDE 0.9 % IV SOLN
INTRAVENOUS | Status: DC
  Administered 2011-07-30 (×2): via INTRAVENOUS

## 2011-07-30 MED ORDER — HYDROCODONE-ACETAMINOPHEN 10-325 MG PO TABS
1.0000 | ORAL_TABLET | Freq: Once | ORAL | Status: AC
  Administered 2011-07-30: 1 via ORAL
  Filled 2011-07-30: qty 1

## 2011-07-30 MED ORDER — FENTANYL CITRATE 0.05 MG/ML IJ SOLN
50.0000 ug | INTRAMUSCULAR | Status: DC | PRN
  Administered 2011-07-31: 100 ug via INTRAVENOUS

## 2011-07-30 MED ORDER — DIAZEPAM 5 MG PO TABS: 5.0000 mg | ORAL_TABLET | Freq: Once | ORAL | Status: DC

## 2011-07-30 MED ORDER — ACETAMINOPHEN 10 MG/ML IV SOLN
1000.0000 mg | Freq: Four times a day (QID) | INTRAVENOUS | Status: DC
  Administered 2011-07-30 – 2011-07-31 (×3): 1000 mg via INTRAVENOUS
  Filled 2011-07-30 (×4): qty 100

## 2011-07-30 MED ORDER — CEFAZOLIN SODIUM 1-5 GM-% IV SOLN
1.0000 g | INTRAVENOUS | Status: AC
  Administered 2011-07-31: 1 g via INTRAVENOUS
  Filled 2011-07-30: qty 50

## 2011-07-30 MED ORDER — ALBUTEROL SULFATE (5 MG/ML) 0.5% IN NEBU: 2.5000 mg | INHALATION_SOLUTION | RESPIRATORY_TRACT | Status: DC | PRN

## 2011-07-30 MED ORDER — MIDAZOLAM HCL 2 MG/2ML IJ SOLN: 1.0000 mg | INTRAMUSCULAR | Status: DC | PRN

## 2011-07-30 MED ORDER — CHLORHEXIDINE GLUCONATE 4 % EX LIQD
60.0000 mL | Freq: Once | CUTANEOUS | Status: AC
  Administered 2011-07-31: 4 via TOPICAL
  Filled 2011-07-30: qty 60

## 2011-07-30 MED ORDER — LACTATED RINGERS IV SOLN
INTRAVENOUS | Status: DC
  Administered 2011-07-31 (×2): via INTRAVENOUS

## 2011-07-30 MED ORDER — CHLORHEXIDINE GLUCONATE 4 % EX LIQD
60.0000 mL | Freq: Once | CUTANEOUS | Status: AC
  Administered 2011-07-30: 4 via TOPICAL
  Filled 2011-07-30: qty 60

## 2011-07-30 NOTE — Progress Notes (Signed)
RT assessment done at this time. Patient stated that she just takes Albuterol HHN PRN at home. She seems to be doing great. BBS clear, SAT 98% on RA. Chest xray clear. I changed HHN to PRN only, and continued MDI. RT will continue to monitor.

## 2011-07-30 NOTE — Progress Notes (Signed)
  Echocardiogram 2D Echocardiogram has been performed.  Cathie Beams Deneen 07/30/2011, 10:30 AM

## 2011-07-30 NOTE — Progress Notes (Signed)
Pt. woke up in tears due to pain both legs  especially in her knees, cms of both legs + , page MD on call due pain med is not yet due. K. SchorrNP made aware with orders made, extra dose of 1 hydrocodone po given, will cont. to monitor pt. Pain.

## 2011-07-30 NOTE — Care Management Note (Signed)
    Page 1 of 1   07/30/2011     2:04:10 PM   CARE MANAGEMENT NOTE 07/30/2011  Patient:  [name], [name]   Account Number:  [id number]  Date Initiated:  07/30/2011  Documentation initiated by:  [facility]  Subjective/Objective Assessment:   Admitted with hip fx  Has daughter and brother.  Lives in [place], [place].     Action/Plan:   Anticipated DC Date:  08/06/2011   Anticipated DC Plan:  HOME W HOME HEALTH SERVICES      DC Planning Services  CM consult      Choice offered to / List presented to:             Status of service:  In process, will continue to follow Medicare Important Message given?   (If response is "NO", the following Medicare IM given date fields will be blank) Date Medicare IM given:   Date Additional Medicare IM given:    Discharge Disposition:    Per UR Regulation:  Reviewed for med. necessity/level of care/duration of stay  If discussed at Long Length of Stay Meetings, dates discussed:    Comments:  Daughter Bernardo Heater - [phone]  07-30-11 12:20 Avie Arenas, RNBSN [phone] Patient in bed - appears sleepy due to droopy eyes and slow speech.  phone ringing - did not acknowlege hearing it till shown her phone was ringing. Answered phone - will come back to see. 2pm spoke with patient.  has RW, WC and BSC at home.  Would like rollator on discharge. has niece that is NA who would like to care for her on discharge.  Will continue to follow for further needs.

## 2011-07-30 NOTE — Consult Note (Signed)
Reason for Consult: Left Hip Fracture  [name] is an 51 y.o. female.  HPI: Patient is a 51year old female seen in the office following multiple falls.  She stated she had been worked up for low back pain with MRI and given epidural injections although she still c/o left hip pain.  X-rays were taken of the left hip in the office revealing a left subcap hip fracture.  We are planning on surgical fixation of this Friday, but after seeing the patient in the office for preop evaluation she is a poor historian regarding medical history, she did not provide a list of medicines at the time of visit, but said "im on a bag full".  She has hx of asthma and COPD said she uses multiple inhalers and is on home O2 through nasal canula at night.  Also with HTN, GERD, questionalbe hx of stroke and/or seizures.  We had asked she be admitted to the medicine service prior to planned surgery for evaluation and preoperative clearance.  Past Medical History  Diagnosis Date  . Asthma   . Bronchitis   . Hypertension   . GERD (gastroesophageal reflux disease)   . Chronic back pain   . Sciatic nerve pain   . Shortness of breath   . Sleep apnea     stops breathing at night sometimes, uses o2 at 2L HS  . Arthritis   . Anxiety   . COPD (chronic obstructive pulmonary disease)   . Headache     Past Surgical History  Procedure Date  . Cholecystectomy   . Abdominal hysterectomy   . Tubal ligation     History reviewed. No pertinent family history.  Social History:  reports that she has been smoking Cigarettes.  She has a 16.5 pack-year smoking history. She has never used smokeless tobacco. She reports that she does not drink alcohol or use illicit drugs.  Allergies: No Known Allergies  Medications:  Prior to Admission:  Prescriptions prior to admission  Medication Sig Dispense Refill  . albuterol (PROVENTIL HFA;VENTOLIN HFA) 108 (90 BASE) MCG/ACT inhaler Inhale 2 puffs into the lungs every 6 (six) hours  as needed. shortness of breath       . albuterol (PROVENTIL) (2.5 MG/3ML) 0.083% nebulizer solution Take 2.5 mg by nebulization every 6 (six) hours as needed. wheezing/shortness of breath      . aspirin EC 81 MG tablet Take 81 mg by mouth daily.      . clonazePAM (KLONOPIN) 0.5 MG tablet Take 0.5 mg by mouth 3 (three) times daily.       . Fluticasone-Salmeterol (ADVAIR) 500-50 MCG/DOSE AEPB Inhale 1 puff into the lungs every 12 (twelve) hours.        Marland Kitchen HYDROcodone-acetaminophen (NORCO) 10-325 MG per tablet Take 1 tablet by mouth every 4 (four) hours as needed. For pain      . lisinopril (PRINIVIL,ZESTRIL) 20 MG tablet Take 20 mg by mouth daily.        Marland Kitchen omeprazole (PRILOSEC) 20 MG capsule Take 20 mg by mouth daily.      . predniSONE (DELTASONE) 20 MG tablet Take 20 mg by mouth See admin instructions. Take 3 tabs daily x1 week, 2 tabs 3 times daily, 1 tab 3 times daily, 1/2 tab x3 days then 1/2 tab every other day until gone per patient      . tiotropium (SPIRIVA) 18 MCG inhalation capsule Place 18 mcg into inhaler and inhale daily.  Results for orders placed during the hospital encounter of 07/29/11 (from the past 48 hour(s))  CBC     Status: Abnormal   Collection Time   07/29/11  5:10 PM      Component Value Range Comment   WBC 5.5  4.0 - 10.5 (K/uL)    RBC 4.01  3.87 - 5.11 (MIL/uL)    Hemoglobin 12.7  12.0 - 15.0 (g/dL)    HCT 16.1  09.6 - 04.5 (%)    MCV 95.0  78.0 - 100.0 (fL)    MCH 31.7  26.0 - 34.0 (pg)    MCHC 33.3  30.0 - 36.0 (g/dL)    RDW 40.9  81.1 - 91.4 (%)    Platelets 92 (*) 150 - 400 (K/uL) PLATELET COUNT CONFIRMED BY SMEAR  COMPREHENSIVE METABOLIC PANEL     Status: Abnormal   Collection Time   07/29/11  5:10 PM      Component Value Range Comment   Sodium 140  135 - 145 (mEq/L)    Potassium 3.5  3.5 - 5.1 (mEq/L)    Chloride 103  96 - 112 (mEq/L)    CO2 28  19 - 32 (mEq/L)    Glucose, Bld 154 (*) 70 - 99 (mg/dL)    BUN 13  6 - 23 (mg/dL)    Creatinine, Ser  7.82  0.50 - 1.10 (mg/dL)    Calcium 9.3  8.4 - 10.5 (mg/dL)    Total Protein 6.9  6.0 - 8.3 (g/dL)    Albumin 3.4 (*) 3.5 - 5.2 (g/dL)    AST 31  0 - 37 (U/L)    ALT 48 (*) 0 - 35 (U/L)    Alkaline Phosphatase 139 (*) 39 - 117 (U/L)    Total Bilirubin 0.4  0.3 - 1.2 (mg/dL)    GFR calc non Af Amer >90  >90 (mL/min)    GFR calc Af Amer >90  >90 (mL/min)   DIFFERENTIAL     Status: Normal   Collection Time   07/29/11  5:10 PM      Component Value Range Comment   Neutrophils Relative 72  43 - 77 (%)    Neutro Abs 3.9  1.7 - 7.7 (K/uL)    Lymphocytes Relative 24  12 - 46 (%)    Lymphs Abs 1.3  0.7 - 4.0 (K/uL)    Monocytes Relative 4  3 - 12 (%)    Monocytes Absolute 0.2  0.1 - 1.0 (K/uL)    Eosinophils Relative 1  0 - 5 (%)    Eosinophils Absolute 0.0  0.0 - 0.7 (K/uL)    Basophils Relative 0  0 - 1 (%)    Basophils Absolute 0.0  0.0 - 0.1 (K/uL)   TSH     Status: Abnormal   Collection Time   07/29/11  5:10 PM      Component Value Range Comment   TSH 0.338 (*) 0.350 - 4.500 (uIU/mL)   PROTIME-INR     Status: Normal   Collection Time   07/29/11  5:10 PM      Component Value Range Comment   Prothrombin Time 14.8  11.6 - 15.2 (seconds)    INR 1.14  0.00 - 1.49    HEMOGLOBIN A1C     Status: Normal   Collection Time   07/29/11  5:10 PM      Component Value Range Comment   Hemoglobin A1C 5.4  <5.7 (%)    Mean Plasma Glucose 108  <  117 (mg/dL)   CARDIAC PANEL(CRET KIN+CKTOT+MB+TROPI)     Status: Normal   Collection Time   07/29/11  5:11 PM      Component Value Range Comment   Total CK 51  7 - 177 (U/L)    CK, MB 2.4  0.3 - 4.0 (ng/mL)    Troponin I <0.30  <0.30 (ng/mL)    Relative Index RELATIVE INDEX IS INVALID  0.0 - 2.5    URINALYSIS, ROUTINE W REFLEX MICROSCOPIC     Status: Normal   Collection Time   07/29/11  6:33 PM      Component Value Range Comment   Color, Urine YELLOW  YELLOW     APPearance CLEAR  CLEAR     Specific Gravity, Urine 1.018  1.005 - 1.030     pH 6.5  5.0 - 8.0      Glucose, UA NEGATIVE  NEGATIVE (mg/dL)    Hgb urine dipstick NEGATIVE  NEGATIVE     Bilirubin Urine NEGATIVE  NEGATIVE     Ketones, ur NEGATIVE  NEGATIVE (mg/dL)    Protein, ur NEGATIVE  NEGATIVE (mg/dL)    Urobilinogen, UA 1.0  0.0 - 1.0 (mg/dL)    Nitrite NEGATIVE  NEGATIVE     Leukocytes, UA NEGATIVE  NEGATIVE  MICROSCOPIC NOT DONE ON URINES WITH NEGATIVE PROTEIN, BLOOD, LEUKOCYTES, NITRITE, OR GLUCOSE <1000 mg/dL.  CARDIAC PANEL(CRET KIN+CKTOT+MB+TROPI)     Status: Normal   Collection Time   07/30/11 12:45 AM      Component Value Range Comment   Total CK 46  7 - 177 (U/L)    CK, MB 2.2  0.3 - 4.0 (ng/mL)    Troponin I <0.30  <0.30 (ng/mL)    Relative Index RELATIVE INDEX IS INVALID  0.0 - 2.5    SURGICAL PCR SCREEN     Status: Normal   Collection Time   07/30/11  5:07 AM      Component Value Range Comment   MRSA, PCR NEGATIVE  NEGATIVE     Staphylococcus aureus NEGATIVE  NEGATIVE    CBC     Status: Abnormal   Collection Time   07/30/11  6:00 AM      Component Value Range Comment   WBC 5.3  4.0 - 10.5 (K/uL)    RBC 3.75 (*) 3.87 - 5.11 (MIL/uL)    Hemoglobin 11.9 (*) 12.0 - 15.0 (g/dL)    HCT 16.1  09.6 - 04.5 (%)    MCV 96.8  78.0 - 100.0 (fL)    MCH 31.7  26.0 - 34.0 (pg)    MCHC 32.8  30.0 - 36.0 (g/dL)    RDW 40.9 (*) 81.1 - 15.5 (%)    Platelets 99 (*) 150 - 400 (K/uL) CONSISTENT WITH PREVIOUS RESULT  BASIC METABOLIC PANEL     Status: Normal   Collection Time   07/30/11  6:00 AM      Component Value Range Comment   Sodium 142  135 - 145 (mEq/L)    Potassium 3.5  3.5 - 5.1 (mEq/L)    Chloride 106  96 - 112 (mEq/L)    CO2 30  19 - 32 (mEq/L)    Glucose, Bld 77  70 - 99 (mg/dL)    BUN 14  6 - 23 (mg/dL)    Creatinine, Ser 9.14  0.50 - 1.10 (mg/dL)    Calcium 9.0  8.4 - 10.5 (mg/dL)    GFR calc non Af Amer >90  >90 (mL/min)  GFR calc Af Amer >90  >90 (mL/min)     Dg Chest Port 1 View  07/29/2011  *RADIOLOGY REPORT*  Clinical Data: Preop  PORTABLE CHEST - 1 VIEW   Comparison: CT chest of 01/09/2009 and portable chest x-ray of 01/09/2009  Findings: The lungs are clear.  Mediastinal contours appear normal. The heart is within normal limits in size.  No bony abnormality is seen.  IMPRESSION: No active lung disease.  Original Report Authenticated By: Juline Patch, M.D.    Review of Systems  Constitutional: Negative for fever, chills and malaise/fatigue.  HENT: Negative for hearing loss, nosebleeds, congestion and sore throat.   Eyes: Negative for blurred vision, pain and discharge.  Respiratory: Positive for shortness of breath. Negative for cough, hemoptysis, sputum production and wheezing.   Cardiovascular: Positive for chest pain and leg swelling. Negative for palpitations and orthopnea.  Gastrointestinal: Positive for nausea, vomiting and constipation. Negative for heartburn, abdominal pain, diarrhea and blood in stool.  Genitourinary: Positive for dysuria, urgency and frequency. Negative for hematuria and flank pain.  Skin: Negative for itching and rash.  Neurological: Positive for dizziness, seizures and headaches. Negative for tingling, sensory change and loss of consciousness.  Endo/Heme/Allergies: Negative for polydipsia. Does not bruise/bleed easily.  Psychiatric/Behavioral: Positive for depression. Negative for suicidal ideas and substance abuse. The patient does not have insomnia.    Blood pressure 114/79, pulse 72, temperature 97.8 F (36.6 C), temperature source Oral, resp. rate 18, SpO2 100.00%. Physical Exam  Constitutional: She is oriented to person, place, and time. She appears well-developed.  HENT:  Head: Normocephalic and atraumatic.  Mouth/Throat: Oropharynx is clear and moist.  Eyes: Conjunctivae and EOM are normal. Pupils are equal, round, and reactive to light. Right eye exhibits no discharge. Left eye exhibits no discharge.  Neck: Normal range of motion. Neck supple.  Cardiovascular: Normal rate, regular rhythm, normal heart  sounds and intact distal pulses.   No murmur heard. Respiratory: Effort normal and breath sounds normal. No respiratory distress. She has no wheezes.  GI: Soft. Bowel sounds are normal. She exhibits no distension. There is no tenderness.  Musculoskeletal: She exhibits tenderness.       Left hip: She exhibits decreased range of motion, tenderness and swelling.  Lymphadenopathy:    She has no cervical adenopathy.  Neurological: She is alert and oriented to person, place, and time. No cranial nerve deficit.  Skin: Skin is warm and dry. No erythema.  Psychiatric: She has a normal mood and affect. Her behavior is normal.    Assessment/Plan: Left Subcap femur fracture   Plan is for left hip hemiarthroplasty vs THA on Friday.  She will undergo preop workup today with echo and EKG per med recommendations.  She should remain NWB LLE, NPO after midnight.  Patient states she does do better with 10mg  Lortab over Percocet we will keep this in mind for pain control perioperatively.  Will need DVT prophylaxis with Lovenox following surgery for two weeks.  Will treat with ancef at time of surgery.  We appreciate medicine involvement in care of this patient.    Veera Stapleton, Ivin Booty 07/30/2011, 8:15 AM

## 2011-07-30 NOTE — Progress Notes (Addendum)
Subjective: Pain seems to be controlled  Objective: Vital signs in last 24 hours: Filed Vitals:   07/29/11 1846 07/29/11 2040 07/29/11 2125 07/30/11 0705  BP: 121/80 126/70  114/79  Pulse: 61 78  72  Temp: 98 F (36.7 C) 98.7 F (37.1 C)  97.8 F (36.6 C)  TempSrc: Oral Oral  Oral  Resp: 18 18  18   SpO2: 100% 99% 98% 100%    Intake/Output Summary (Last 24 hours) at 07/30/11 0724 Last data filed at 07/30/11 0700  Gross per 24 hour  Intake 1381.25 ml  Output    450 ml  Net 931.25 ml    Weight change:   Head: Normocephalic and atraumatic.  Eyes: Conjunctivae and EOM are normal. Pupils are equal, round, and reactive to light.  Neck: Normal range of motion and phonation normal. Neck supple.  Cardiovascular: Normal rate, regular rhythm and intact distal pulses. Exam reveals no gallop and no friction rub.  No murmur heard.  Pulmonary/Chest: Effort normal and breath sounds normal. She has no wheezes. She has no rales.  Abdominal: Soft. She exhibits no distension. There is no tenderness. There is no guarding.  Musculoskeletal: Normal range of motion. She exhibits no tenderness.  Left lumbar spine tenderness. No palpable tenderness to LLE but pain noted to left knee and hip.  Neurological: She is alert and oriented to person, place, and time. She has normal strength. She exhibits normal muscle tone.  Lower extremity strength normal and equal bilaterally. Plantar flexion and extension normal bilaterally.  Skin: Skin is warm and dry.  Psychiatric: She has a normal mood and affect. Her behavior is normal. Judgment and thought content normal.      Lab Results: Results for orders placed during the hospital encounter of 07/29/11 (from the past 24 hour(s))  CBC     Status: Abnormal   Collection Time   07/29/11  5:10 PM      Component Value Range   WBC 5.5  4.0 - 10.5 (K/uL)   RBC 4.01  3.87 - 5.11 (MIL/uL)   Hemoglobin 12.7  12.0 - 15.0 (g/dL)   HCT 16.1  09.6 - 04.5 (%)   MCV  95.0  78.0 - 100.0 (fL)   MCH 31.7  26.0 - 34.0 (pg)   MCHC 33.3  30.0 - 36.0 (g/dL)   RDW 40.9  81.1 - 91.4 (%)   Platelets 92 (*) 150 - 400 (K/uL)  COMPREHENSIVE METABOLIC PANEL     Status: Abnormal   Collection Time   07/29/11  5:10 PM      Component Value Range   Sodium 140  135 - 145 (mEq/L)   Potassium 3.5  3.5 - 5.1 (mEq/L)   Chloride 103  96 - 112 (mEq/L)   CO2 28  19 - 32 (mEq/L)   Glucose, Bld 154 (*) 70 - 99 (mg/dL)   BUN 13  6 - 23 (mg/dL)   Creatinine, Ser 7.82  0.50 - 1.10 (mg/dL)   Calcium 9.3  8.4 - 95.6 (mg/dL)   Total Protein 6.9  6.0 - 8.3 (g/dL)   Albumin 3.4 (*) 3.5 - 5.2 (g/dL)   AST 31  0 - 37 (U/L)   ALT 48 (*) 0 - 35 (U/L)   Alkaline Phosphatase 139 (*) 39 - 117 (U/L)   Total Bilirubin 0.4  0.3 - 1.2 (mg/dL)   GFR calc non Af Amer >90  >90 (mL/min)   GFR calc Af Amer >90  >90 (mL/min)  DIFFERENTIAL  Status: Normal   Collection Time   07/29/11  5:10 PM      Component Value Range   Neutrophils Relative 72  43 - 77 (%)   Neutro Abs 3.9  1.7 - 7.7 (K/uL)   Lymphocytes Relative 24  12 - 46 (%)   Lymphs Abs 1.3  0.7 - 4.0 (K/uL)   Monocytes Relative 4  3 - 12 (%)   Monocytes Absolute 0.2  0.1 - 1.0 (K/uL)   Eosinophils Relative 1  0 - 5 (%)   Eosinophils Absolute 0.0  0.0 - 0.7 (K/uL)   Basophils Relative 0  0 - 1 (%)   Basophils Absolute 0.0  0.0 - 0.1 (K/uL)  TSH     Status: Abnormal   Collection Time   07/29/11  5:10 PM      Component Value Range   TSH 0.338 (*) 0.350 - 4.500 (uIU/mL)  PROTIME-INR     Status: Normal   Collection Time   07/29/11  5:10 PM      Component Value Range   Prothrombin Time 14.8  11.6 - 15.2 (seconds)   INR 1.14  0.00 - 1.49   HEMOGLOBIN A1C     Status: Normal   Collection Time   07/29/11  5:10 PM      Component Value Range   Hemoglobin A1C 5.4  <5.7 (%)   Mean Plasma Glucose 108  <117 (mg/dL)  CARDIAC PANEL(CRET KIN+CKTOT+MB+TROPI)     Status: Normal   Collection Time   07/29/11  5:11 PM      Component Value Range    Total CK 51  7 - 177 (U/L)   CK, MB 2.4  0.3 - 4.0 (ng/mL)   Troponin I <0.30  <0.30 (ng/mL)   Relative Index RELATIVE INDEX IS INVALID  0.0 - 2.5   URINALYSIS, ROUTINE W REFLEX MICROSCOPIC     Status: Normal   Collection Time   07/29/11  6:33 PM      Component Value Range   Color, Urine YELLOW  YELLOW    APPearance CLEAR  CLEAR    Specific Gravity, Urine 1.018  1.005 - 1.030    pH 6.5  5.0 - 8.0    Glucose, UA NEGATIVE  NEGATIVE (mg/dL)   Hgb urine dipstick NEGATIVE  NEGATIVE    Bilirubin Urine NEGATIVE  NEGATIVE    Ketones, ur NEGATIVE  NEGATIVE (mg/dL)   Protein, ur NEGATIVE  NEGATIVE (mg/dL)   Urobilinogen, UA 1.0  0.0 - 1.0 (mg/dL)   Nitrite NEGATIVE  NEGATIVE    Leukocytes, UA NEGATIVE  NEGATIVE   CARDIAC PANEL(CRET KIN+CKTOT+MB+TROPI)     Status: Normal   Collection Time   07/30/11 12:45 AM      Component Value Range   Total CK 46  7 - 177 (U/L)   CK, MB 2.2  0.3 - 4.0 (ng/mL)   Troponin I <0.30  <0.30 (ng/mL)   Relative Index RELATIVE INDEX IS INVALID  0.0 - 2.5   SURGICAL PCR SCREEN     Status: Normal   Collection Time   07/30/11  5:07 AM      Component Value Range   MRSA, PCR NEGATIVE  NEGATIVE    Staphylococcus aureus NEGATIVE  NEGATIVE   CBC     Status: Abnormal   Collection Time   07/30/11  6:00 AM      Component Value Range   WBC 5.3  4.0 - 10.5 (K/uL)   RBC 3.75 (*) 3.87 - 5.11 (  MIL/uL)   Hemoglobin 11.9 (*) 12.0 - 15.0 (g/dL)   HCT 16.1  09.6 - 04.5 (%)   MCV 96.8  78.0 - 100.0 (fL)   MCH 31.7  26.0 - 34.0 (pg)   MCHC 32.8  30.0 - 36.0 (g/dL)   RDW 40.9 (*) 81.1 - 15.5 (%)   Platelets 99 (*) 150 - 400 (K/uL)  BASIC METABOLIC PANEL     Status: Normal   Collection Time   07/30/11  6:00 AM      Component Value Range   Sodium 142  135 - 145 (mEq/L)   Potassium 3.5  3.5 - 5.1 (mEq/L)   Chloride 106  96 - 112 (mEq/L)   CO2 30  19 - 32 (mEq/L)   Glucose, Bld 77  70 - 99 (mg/dL)   BUN 14  6 - 23 (mg/dL)   Creatinine, Ser 9.14  0.50 - 1.10 (mg/dL)   Calcium 9.0   8.4 - 10.5 (mg/dL)   GFR calc non Af Amer >90  >90 (mL/min)   GFR calc Af Amer >90  >90 (mL/min)     Micro: Recent Results (from the past 240 hour(s))  SURGICAL PCR SCREEN     Status: Normal   Collection Time   07/30/11  5:07 AM      Component Value Range Status Comment   MRSA, PCR NEGATIVE  NEGATIVE  Final    Staphylococcus aureus NEGATIVE  NEGATIVE  Final     Studies/Results: Dg Chest Port 1 View  07/29/2011  *RADIOLOGY REPORT*  Clinical Data: Preop  PORTABLE CHEST - 1 VIEW  Comparison: CT chest of 01/09/2009 and portable chest x-ray of 01/09/2009  Findings: The lungs are clear.  Mediastinal contours appear normal. The heart is within normal limits in size.  No bony abnormality is seen.  IMPRESSION: No active lung disease.  Original Report Authenticated By: Juline Patch, M.D.    Medications:  Scheduled Meds:   . aspirin EC  81 mg Oral Daily  . clonazePAM  0.5 mg Oral TID  . diazepam  5 mg Oral Once  . docusate sodium  100 mg Oral BID  . enoxaparin  40 mg Subcutaneous Q24H  . Fluticasone-Salmeterol  1 puff Inhalation Q12H  . HYDROcodone-acetaminophen  1 tablet Oral Once  . ipratropium  0.5 mg Nebulization Q6H  . pantoprazole  40 mg Oral Q1200  . pneumococcal 23 valent vaccine  0.5 mL Intramuscular Once  . senna  1 tablet Oral BID  . sodium chloride  3 mL Intravenous Q12H  . tiotropium  18 mcg Inhalation Daily   Continuous Infusions:   . sodium chloride 75 mL/hr at 07/30/11 0000   PRN Meds:.acetaminophen, acetaminophen, albuterol, alum & mag hydroxide-simeth, HYDROcodone-acetaminophen, ondansetron (ZOFRAN) IV, ondansetron, oxyCODONE   Assessment:   #1 right hip fracture, Patient admitted to telemetry for closer monitoring 2-D echo  Is pending for preoperative clearance Dr. Madelon Lips is aware of the patient's admission Possible surgery tomorrow  #2 asthma the patient will be continued on her nebulizer treatments  #3 hypertension currently controlled lisinopril on  hold because of low blood pressure #4 gastroesophageal reflux disease continue with Protonix     LOS: 1 day   [facility] 07/30/2011, 7:24 AM

## 2011-07-30 NOTE — Consult Note (Signed)
Pt is a 1/2 ppd smoker and says she's eager to quit and in action stage. She also requests patches here in the hospital currently. Referred to RN for the 14 mg patch. Recommended pt to stat with 14 mg patch. Discussed patch use instructions and how to taper. Pt voices understanding.Referred to 1-800 quit now for f/u and support. Discussed oral fixation substitutes, second hand smoke and in home smoking policy. Reviewed and gave pt Written education/contact information.

## 2011-07-31 ENCOUNTER — Encounter (HOSPITAL_COMMUNITY): Payer: Self-pay | Admitting: Anesthesiology

## 2011-07-31 ENCOUNTER — Observation Stay (HOSPITAL_COMMUNITY): Payer: Medicaid Other

## 2011-07-31 ENCOUNTER — Encounter (HOSPITAL_COMMUNITY)
Admission: RE | Disposition: A | Discharge status: Home Health | Payer: Self-pay | Source: Ambulatory Visit | Attending: Internal Medicine

## 2011-07-31 ENCOUNTER — Observation Stay (HOSPITAL_COMMUNITY): Payer: Medicaid Other | Admitting: Anesthesiology

## 2011-07-31 DIAGNOSIS — J45909 Unspecified asthma, uncomplicated: Secondary | ICD-10-CM

## 2011-07-31 DIAGNOSIS — S72009A Fracture of unspecified part of neck of unspecified femur, initial encounter for closed fracture: Secondary | ICD-10-CM

## 2011-07-31 DIAGNOSIS — Z96649 Presence of unspecified artificial hip joint: Secondary | ICD-10-CM

## 2011-07-31 DIAGNOSIS — K219 Gastro-esophageal reflux disease without esophagitis: Secondary | ICD-10-CM

## 2011-07-31 HISTORY — PX: TOTAL HIP ARTHROPLASTY: SHX124

## 2011-07-31 LAB — GRAM STAIN

## 2011-07-31 LAB — HEMOGLOBIN A1C
Hgb A1c MFr Bld: 5.5 % (ref <5.7)
Mean Plasma Glucose: 111 mg/dL (ref <117)

## 2011-07-31 SURGERY — ARTHROPLASTY, HIP, TOTAL,POSTERIOR APPROACH
Anesthesia: General | Site: Hip | Laterality: Left | Wound class: Clean

## 2011-07-31 MED ORDER — ONDANSETRON HCL 4 MG/2ML IJ SOLN: 4.0000 mg | Freq: Once | INTRAMUSCULAR | Status: DC | PRN

## 2011-07-31 MED ORDER — CEFAZOLIN SODIUM 1-5 GM-% IV SOLN
1.0000 g | Freq: Four times a day (QID) | INTRAVENOUS | Status: AC
  Administered 2011-07-31 – 2011-08-01 (×3): 1 g via INTRAVENOUS
  Filled 2011-07-31 (×3): qty 50

## 2011-07-31 MED ORDER — FENTANYL CITRATE 0.05 MG/ML IJ SOLN
INTRAMUSCULAR | Status: DC | PRN
Start: 2011-07-31 — End: 2011-07-31
  Administered 2011-07-31 (×2): 50 ug via INTRAVENOUS
  Administered 2011-07-31 (×2): 100 ug via INTRAVENOUS
  Administered 2011-07-31: 50 ug via INTRAVENOUS
  Administered 2011-07-31: 100 ug via INTRAVENOUS

## 2011-07-31 MED ORDER — SODIUM CHLORIDE 0.9 % IV SOLN
INTRAVENOUS | Status: DC
  Administered 2011-07-31 – 2011-08-02 (×3): via INTRAVENOUS

## 2011-07-31 MED ORDER — LACTATED RINGERS IV SOLN
INTRAVENOUS | Status: DC | PRN
  Administered 2011-07-31 (×2): via INTRAVENOUS

## 2011-07-31 MED ORDER — ENOXAPARIN SODIUM 30 MG/0.3ML ~~LOC~~ SOLN
30.0000 mg | Freq: Two times a day (BID) | SUBCUTANEOUS | Status: DC
  Administered 2011-08-01 (×2): 30 mg via SUBCUTANEOUS
  Filled 2011-07-31 (×5): qty 0.3

## 2011-07-31 MED ORDER — SODIUM CHLORIDE 0.9 % IR SOLN
Status: DC | PRN
  Administered 2011-07-31: 1000 mL

## 2011-07-31 MED ORDER — NEOSTIGMINE METHYLSULFATE 1 MG/ML IJ SOLN
INTRAMUSCULAR | Status: DC | PRN
  Administered 2011-07-31: 4 mg via INTRAVENOUS

## 2011-07-31 MED ORDER — MENTHOL 3 MG MT LOZG
1.0000 | LOZENGE | OROMUCOSAL | Status: DC | PRN
  Filled 2011-07-31: qty 9

## 2011-07-31 MED ORDER — HYDROMORPHONE HCL PF 1 MG/ML IJ SOLN: 0.5000 mg | INTRAMUSCULAR | Status: DC | PRN

## 2011-07-31 MED ORDER — GLYCOPYRROLATE 0.2 MG/ML IJ SOLN
INTRAMUSCULAR | Status: DC | PRN
  Administered 2011-07-31: .5 mg via INTRAVENOUS

## 2011-07-31 MED ORDER — ACETAMINOPHEN 10 MG/ML IV SOLN
1000.0000 mg | Freq: Four times a day (QID) | INTRAVENOUS | Status: AC
  Administered 2011-07-31 – 2011-08-01 (×3): 1000 mg via INTRAVENOUS
  Filled 2011-07-31 (×4): qty 100

## 2011-07-31 MED ORDER — NICOTINE 14 MG/24HR TD PT24
14.0000 mg | MEDICATED_PATCH | Freq: Every day | TRANSDERMAL | Status: DC
  Administered 2011-08-01 – 2011-08-03 (×4): 14 mg via TRANSDERMAL
  Filled 2011-07-31 (×5): qty 1

## 2011-07-31 MED ORDER — DEXTROSE 5 % IV SOLN
INTRAVENOUS | Status: DC | PRN
  Administered 2011-07-31: 14:00:00 via INTRAVENOUS

## 2011-07-31 MED ORDER — DEXAMETHASONE SODIUM PHOSPHATE 4 MG/ML IJ SOLN
INTRAMUSCULAR | Status: DC | PRN
  Administered 2011-07-31: 8 mg via INTRAVENOUS

## 2011-07-31 MED ORDER — MIDAZOLAM HCL 5 MG/5ML IJ SOLN
INTRAMUSCULAR | Status: DC | PRN
  Administered 2011-07-31: 2 mg via INTRAVENOUS

## 2011-07-31 MED ORDER — TIOTROPIUM BROMIDE MONOHYDRATE 18 MCG IN CAPS
18.0000 ug | ORAL_CAPSULE | Freq: Every day | RESPIRATORY_TRACT | Status: DC
  Administered 2011-08-01 – 2011-08-03 (×3): 18 ug via RESPIRATORY_TRACT
  Filled 2011-07-31: qty 5

## 2011-07-31 MED ORDER — LIDOCAINE HCL 4 % MT SOLN
OROMUCOSAL | Status: DC | PRN
  Administered 2011-07-31: 4 mL via TOPICAL

## 2011-07-31 MED ORDER — HETASTARCH-ELECTROLYTES 6 % IV SOLN
INTRAVENOUS | Status: DC | PRN
  Administered 2011-07-31: 15:00:00 via INTRAVENOUS

## 2011-07-31 MED ORDER — LIDOCAINE HCL (CARDIAC) 20 MG/ML IV SOLN
INTRAVENOUS | Status: DC | PRN
  Administered 2011-07-31: 50 mg via INTRAVENOUS

## 2011-07-31 MED ORDER — ONDANSETRON HCL 4 MG/2ML IJ SOLN
INTRAMUSCULAR | Status: DC | PRN
  Administered 2011-07-31: 4 mg via INTRAVENOUS

## 2011-07-31 MED ORDER — PHENOL 1.4 % MT LIQD
1.0000 | OROMUCOSAL | Status: DC | PRN
  Filled 2011-07-31: qty 177

## 2011-07-31 MED ORDER — METHOCARBAMOL 100 MG/ML IJ SOLN
500.0000 mg | Freq: Four times a day (QID) | INTRAVENOUS | Status: DC | PRN
  Filled 2011-07-31: qty 5

## 2011-07-31 MED ORDER — METOCLOPRAMIDE HCL 5 MG PO TABS
5.0000 mg | ORAL_TABLET | Freq: Three times a day (TID) | ORAL | Status: DC | PRN
  Filled 2011-07-31: qty 2

## 2011-07-31 MED ORDER — METHOCARBAMOL 500 MG PO TABS
500.0000 mg | ORAL_TABLET | Freq: Four times a day (QID) | ORAL | Status: DC | PRN
  Administered 2011-08-02: 500 mg via ORAL
  Filled 2011-07-31 (×2): qty 1

## 2011-07-31 MED ORDER — HYDROMORPHONE HCL PF 1 MG/ML IJ SOLN
0.5000 mg | INTRAMUSCULAR | Status: DC | PRN
  Administered 2011-07-31: 0.5 mg via INTRAVENOUS

## 2011-07-31 MED ORDER — FENTANYL CITRATE 0.05 MG/ML IJ SOLN: 50.0000 ug | INTRAMUSCULAR | Status: DC | PRN

## 2011-07-31 MED ORDER — HYDROMORPHONE HCL PF 1 MG/ML IJ SOLN: 0.2500 mg | INTRAMUSCULAR | Status: DC | PRN

## 2011-07-31 MED ORDER — OXYCODONE HCL 5 MG PO TABS
10.0000 mg | ORAL_TABLET | ORAL | Status: DC | PRN
  Administered 2011-07-31 – 2011-08-02 (×7): 10 mg via ORAL
  Filled 2011-07-31 (×3): qty 2
  Filled 2011-07-31: qty 1
  Filled 2011-07-31 (×4): qty 2

## 2011-07-31 MED ORDER — ROCURONIUM BROMIDE 100 MG/10ML IV SOLN
INTRAVENOUS | Status: DC | PRN
  Administered 2011-07-31: 50 mg via INTRAVENOUS
  Administered 2011-07-31: 10 mg via INTRAVENOUS
  Administered 2011-07-31: 5 mg via INTRAVENOUS
  Administered 2011-07-31: 10 mg via INTRAVENOUS

## 2011-07-31 MED ORDER — PROPOFOL 10 MG/ML IV EMUL
INTRAVENOUS | Status: DC | PRN
  Administered 2011-07-31: 200 mg via INTRAVENOUS

## 2011-07-31 MED ORDER — FENTANYL CITRATE 0.05 MG/ML IJ SOLN
INTRAMUSCULAR | Status: AC
  Filled 2011-07-31: qty 2

## 2011-07-31 MED ORDER — BUPIVACAINE-EPINEPHRINE 0.5% -1:200000 IJ SOLN
INTRAMUSCULAR | Status: DC | PRN
  Administered 2011-07-31: 10 mL

## 2011-07-31 SURGICAL SUPPLY — 61 items
BLADE SAW SAG 73X25 THK (BLADE) ×1
BLADE SAW SGTL 73X25 THK (BLADE) ×1 IMPLANT
BRUSH FEMORAL CANAL (MISCELLANEOUS) IMPLANT
CLOTH BEACON ORANGE TIMEOUT ST (SAFETY) ×2 IMPLANT
COVER BACK TABLE 24X17X13 BIG (DRAPES) IMPLANT
COVER SURGICAL LIGHT HANDLE (MISCELLANEOUS) ×2 IMPLANT
DRAPE INCISE IOBAN 66X45 STRL (DRAPES) IMPLANT
DRAPE ORTHO SPLIT 77X108 STRL (DRAPES) ×4
DRAPE SURG ORHT 6 SPLT 77X108 (DRAPES) ×2 IMPLANT
DRAPE U-SHAPE 47X51 STRL (DRAPES) ×2 IMPLANT
DRSG ADAPTIC 3X8 NADH LF (GAUZE/BANDAGES/DRESSINGS) ×2 IMPLANT
DRSG PAD ABDOMINAL 8X10 ST (GAUZE/BANDAGES/DRESSINGS) ×3 IMPLANT
DURAPREP 26ML APPLICATOR (WOUND CARE) ×2 IMPLANT
ELECT CAUTERY BLADE 6.4 (BLADE) ×2 IMPLANT
ELECT REM PT RETURN 9FT ADLT (ELECTROSURGICAL) ×2
ELECTRODE REM PT RTRN 9FT ADLT (ELECTROSURGICAL) ×1 IMPLANT
EVACUATOR 1/8 PVC DRAIN (DRAIN) IMPLANT
FACESHIELD LNG OPTICON STERILE (SAFETY) ×5 IMPLANT
GLOVE BIOGEL PI IND STRL 7.0 (GLOVE) IMPLANT
GLOVE BIOGEL PI IND STRL 8 (GLOVE) ×4 IMPLANT
GLOVE BIOGEL PI INDICATOR 7.0 (GLOVE) ×1
GLOVE BIOGEL PI INDICATOR 8 (GLOVE) ×4
GLOVE ECLIPSE 6.5 STRL STRAW (GLOVE) ×1 IMPLANT
GLOVE ORTHO TXT STRL SZ7.5 (GLOVE) ×6 IMPLANT
GLOVE SURG ORTHO 8.0 STRL STRW (GLOVE) ×8 IMPLANT
GLOVE SURG SS PI 7.5 STRL IVOR (GLOVE) ×1 IMPLANT
GOWN EXTRA PROTECTION XL (GOWNS) ×1 IMPLANT
GOWN PREVENTION PLUS XLARGE (GOWN DISPOSABLE) ×4 IMPLANT
GOWN PREVENTION PLUS XXLARGE (GOWN DISPOSABLE) ×2 IMPLANT
GOWN STRL NON-REIN LRG LVL3 (GOWN DISPOSABLE) ×2 IMPLANT
HANDPIECE INTERPULSE COAX TIP (DISPOSABLE)
IMMOBILIZER KNEE 20 (SOFTGOODS)
IMMOBILIZER KNEE 20 THIGH 36 (SOFTGOODS) IMPLANT
IMMOBILIZER KNEE 22 UNIV (SOFTGOODS) IMPLANT
IMMOBILIZER KNEE 24 THIGH 36 (MISCELLANEOUS) IMPLANT
IMMOBILIZER KNEE 24 UNIV (MISCELLANEOUS)
KIT BASIN OR (CUSTOM PROCEDURE TRAY) ×2 IMPLANT
KIT ROOM TURNOVER OR (KITS) ×2 IMPLANT
MANIFOLD NEPTUNE II (INSTRUMENTS) ×2 IMPLANT
NDL MAYO TROCAR (NEEDLE) ×1 IMPLANT
NEEDLE 22X1 1/2 (OR ONLY) (NEEDLE) ×2 IMPLANT
NEEDLE MAYO TROCAR (NEEDLE) ×2 IMPLANT
NS IRRIG 1000ML POUR BTL (IV SOLUTION) ×2 IMPLANT
PACK TOTAL JOINT (CUSTOM PROCEDURE TRAY) ×2 IMPLANT
PAD ARMBOARD 7.5X6 YLW CONV (MISCELLANEOUS) ×4 IMPLANT
PRESSURIZER FEMORAL UNIV (MISCELLANEOUS) IMPLANT
SET HNDPC FAN SPRY TIP SCT (DISPOSABLE) IMPLANT
SPONGE GAUZE 4X4 12PLY (GAUZE/BANDAGES/DRESSINGS) ×2 IMPLANT
STAPLER VISISTAT 35W (STAPLE) ×2 IMPLANT
SUCTION FRAZIER TIP 10 FR DISP (SUCTIONS) ×3 IMPLANT
SUT ETHIBOND NAB CT1 #1 30IN (SUTURE) ×8 IMPLANT
SUT VIC AB 1 CTB1 27 (SUTURE) ×4 IMPLANT
SUT VIC AB 2-0 CT1 27 (SUTURE) ×4
SUT VIC AB 2-0 CT1 TAPERPNT 27 (SUTURE) ×2 IMPLANT
SYR CONTROL 10ML LL (SYRINGE) ×2 IMPLANT
TAPE CLOTH SURG 4X10 WHT LF (GAUZE/BANDAGES/DRESSINGS) ×1 IMPLANT
TOWEL OR 17X24 6PK STRL BLUE (TOWEL DISPOSABLE) ×2 IMPLANT
TOWEL OR 17X26 10 PK STRL BLUE (TOWEL DISPOSABLE) ×2 IMPLANT
TOWER CARTRIDGE SMART MIX (DISPOSABLE) IMPLANT
TRAY FOLEY CATH 14FR (SET/KITS/TRAYS/PACK) ×2 IMPLANT
WATER STERILE IRR 1000ML POUR (IV SOLUTION) ×8 IMPLANT

## 2011-07-31 NOTE — Anesthesia Procedure Notes (Signed)
Procedure Name: Intubation Date/Time: 07/31/2011 2:04 PM Performed by: Rosemae Mcquown S Pre-anesthesia Checklist: Patient identified, Emergency Drugs available, Suction available, Patient being monitored and Timeout performed Patient Re-evaluated:Patient Re-evaluated prior to inductionOxygen Delivery Method: Circle system utilized Preoxygenation: Pre-oxygenation with 100% oxygen Intubation Type: IV induction Ventilation: Mask ventilation without difficulty Laryngoscope Size: Mac and 3 Grade View: Grade I Tube type: Oral Tube size: 8.0 mm Number of attempts: 1 Airway Equipment and Method: Stylet Placement Confirmation: ETT inserted through vocal cords under direct vision,  positive ETCO2 and breath sounds checked- equal and bilateral Secured at: 22 cm Tube secured with: Tape Dental Injury: Teeth and Oropharynx as per pre-operative assessment

## 2011-07-31 NOTE — Preoperative (Signed)
Beta Blockers   Reason not to administer Beta Blockers:Not Applicable 

## 2011-07-31 NOTE — Anesthesia Postprocedure Evaluation (Signed)
Anesthesia Post Note  Patient: [name]  Procedure(s) Performed: Procedure(s) (LRB): TOTAL HIP ARTHROPLASTY (Left)  Anesthesia type: General  Patient location: PACU  Post pain: Pain level controlled and Adequate analgesia  Post assessment: Post-op Vital signs reviewed, Patient's Cardiovascular Status Stable, Respiratory Function Stable, Patent Airway and Pain level controlled  Last Vitals:  Filed Vitals:   07/31/11 1639  BP: 121/78  Pulse: 8  Temp: 36.8 C  Resp: 27    Post vital signs: Reviewed and stable  Level of consciousness: awake, alert  and oriented  Complications: No apparent anesthesia complications

## 2011-07-31 NOTE — Anesthesia Preprocedure Evaluation (Addendum)
Anesthesia Evaluation  Patient identified by MRN, date of birth, ID band Patient awake    Reviewed: Allergy & Precautions, H&P , NPO status , Patient's Chart, lab work & pertinent test results  Airway Mallampati: I TM Distance: >3 FB Neck ROM: full    Dental  (+) Edentulous Upper, Edentulous Lower and Dental Advisory Given   Pulmonary shortness of breath and Long-Term Oxygen Therapy, asthma , sleep apnea , COPD COPD inhaler, Current Smoker,          Cardiovascular hypertension, Pt. on medications Rhythm:regular Rate:Normal     Neuro/Psych  Headaches, Seizures -,  PSYCHIATRIC DISORDERS Anxiety Depression  Neuromuscular disease    GI/Hepatic Neg liver ROS, GERD-  Medicated and Controlled,  Endo/Other  negative endocrine ROS  Renal/GU negative Renal ROS     Musculoskeletal negative musculoskeletal ROS (+)   Abdominal   Peds  Hematology negative hematology ROS (+)   Anesthesia Other Findings   Reproductive/Obstetrics negative OB ROS                         Anesthesia Physical Anesthesia Plan  ASA: II  Anesthesia Plan: General   Post-op Pain Management:    Induction: Intravenous  Airway Management Planned: Oral ETT  Additional Equipment:   Intra-op Plan:   Post-operative Plan: Extubation in OR  Informed Consent: I have reviewed the patients History and Physical, chart, labs and discussed the procedure including the risks, benefits and alternatives for the proposed anesthesia with the patient or authorized representative who has indicated his/her understanding and acceptance.   Dental advisory given  Plan Discussed with: CRNA, Anesthesiologist and Surgeon  Anesthesia Plan Comments:        Anesthesia Quick Evaluation

## 2011-07-31 NOTE — Progress Notes (Signed)
Subjective: Pt requesting a nicotine patch, anxiously awaiting surgery today. He denies CP.  Objective: Vital signs in last 24 hours: Filed Vitals:   07/30/11 0705 07/30/11 1333 07/30/11 2105 07/31/11 0426  BP: 114/79 108/69 126/78 130/83  Pulse: 72 77 82 84  Temp: 97.8 F (36.6 C) 98.3 F (36.8 C) 98.2 F (36.8 C) 98.4 F (36.9 C)  TempSrc: Oral  Oral Oral  Resp: 18 18 18 19   SpO2: 100% 100% 99% 98%    Intake/Output Summary (Last 24 hours) at 07/31/11 0748 Last data filed at 07/31/11 1610  Gross per 24 hour  Intake 2241.67 ml  Output    753 ml  Net 1488.67 ml    Weight change:   General: Alert & oriented x3, in no distress  Neck: Normal range of motion and phonation normal. Neck supple.  Cardiovascular: Normal rate, regular rhythm and intact distal pulses. Exam reveals no gallop and no friction rub.  No murmur heard.  Pulmonary/Chest: Effort normal and breath sounds normal. She has no wheezes. no crackles.  Abdominal: Soft. no distension,NT, no guarding.  Extremities: No cyanosis, no edema Skin: Skin is warm and dry.  Psychiatric: She has a normal mood and affect.     Lab Results: Results for orders placed during the hospital encounter of 07/29/11 (from the past 24 hour(s))  TYPE AND SCREEN     Status: Normal   Collection Time   07/30/11  9:00 AM      Component Value Range   ABO/RH(D) A POS     Antibody Screen NEG     Sample Expiration 08/02/2011    APTT     Status: Normal   Collection Time   07/30/11  9:07 AM      Component Value Range   aPTT 26  24 - 37 (seconds)  CBC     Status: Abnormal   Collection Time   07/30/11  9:07 AM      Component Value Range   WBC 4.7  4.0 - 10.5 (K/uL)   RBC 3.76 (*) 3.87 - 5.11 (MIL/uL)   Hemoglobin 11.9 (*) 12.0 - 15.0 (g/dL)   HCT 96.0  45.4 - 09.8 (%)   MCV 97.1  78.0 - 100.0 (fL)   MCH 31.6  26.0 - 34.0 (pg)   MCHC 32.6  30.0 - 36.0 (g/dL)   RDW 11.9 (*) 14.7 - 15.5 (%)   Platelets 96 (*) 150 - 400 (K/uL)    COMPREHENSIVE METABOLIC PANEL     Status: Abnormal   Collection Time   07/30/11  9:07 AM      Component Value Range   Sodium 143  135 - 145 (mEq/L)   Potassium 3.1 (*) 3.5 - 5.1 (mEq/L)   Chloride 105  96 - 112 (mEq/L)   CO2 29  19 - 32 (mEq/L)   Glucose, Bld 109 (*) 70 - 99 (mg/dL)   BUN 13  6 - 23 (mg/dL)   Creatinine, Ser 8.29  0.50 - 1.10 (mg/dL)   Calcium 9.0  8.4 - 56.2 (mg/dL)   Total Protein 6.1  6.0 - 8.3 (g/dL)   Albumin 2.9 (*) 3.5 - 5.2 (g/dL)   AST 24  0 - 37 (U/L)   ALT 37 (*) 0 - 35 (U/L)   Alkaline Phosphatase 122 (*) 39 - 117 (U/L)   Total Bilirubin 0.4  0.3 - 1.2 (mg/dL)   GFR calc non Af Amer >90  >90 (mL/min)   GFR calc Af Amer >90  >  90 (mL/min)  DIFFERENTIAL     Status: Normal   Collection Time   07/30/11  9:07 AM      Component Value Range   Neutrophils Relative 62  43 - 77 (%)   Neutro Abs 2.9  1.7 - 7.7 (K/uL)   Lymphocytes Relative 33  12 - 46 (%)   Lymphs Abs 1.6  0.7 - 4.0 (K/uL)   Monocytes Relative 4  3 - 12 (%)   Monocytes Absolute 0.2  0.1 - 1.0 (K/uL)   Eosinophils Relative 2  0 - 5 (%)   Eosinophils Absolute 0.1  0.0 - 0.7 (K/uL)   Basophils Relative 0  0 - 1 (%)   Basophils Absolute 0.0  0.0 - 0.1 (K/uL)  PROTIME-INR     Status: Normal   Collection Time   07/30/11  9:07 AM      Component Value Range   Prothrombin Time 14.4  11.6 - 15.2 (seconds)   INR 1.10  0.00 - 1.49   HEMOGLOBIN A1C     Status: Normal   Collection Time   07/30/11  9:07 AM      Component Value Range   Hemoglobin A1C 5.5  <5.7 (%)   Mean Plasma Glucose 111  <117 (mg/dL)  CARDIAC PANEL(CRET KIN+CKTOT+MB+TROPI)     Status: Normal   Collection Time   07/30/11  9:14 AM      Component Value Range   Total CK 77  7 - 177 (U/L)   CK, MB 3.6  0.3 - 4.0 (ng/mL)   Troponin I <0.30  <0.30 (ng/mL)   Relative Index RELATIVE INDEX IS INVALID  0.0 - 2.5      Micro: Recent Results (from the past 240 hour(s))  SURGICAL PCR SCREEN     Status: Normal   Collection Time   07/30/11  5:07  AM      Component Value Range Status Comment   MRSA, PCR NEGATIVE  NEGATIVE  Final    Staphylococcus aureus NEGATIVE  NEGATIVE  Final     Studies/Results: Dg Chest Portable 1 View  07/30/2011  *RADIOLOGY REPORT*  Clinical Data: Preoperative respiratory exam for left hip fracture.  CHEST - 1 VIEW  Comparison:  07/29/2011  Findings: The heart size and mediastinal contours are within normal limits.  Both lungs are clear.  IMPRESSION: No active disease.  Original Report Authenticated By: Reola Calkins, M.D.   Dg Chest Port 1 View  07/29/2011  *RADIOLOGY REPORT*  Clinical Data: Preop  PORTABLE CHEST - 1 VIEW  Comparison: CT chest of 01/09/2009 and portable chest x-ray of 01/09/2009  Findings: The lungs are clear.  Mediastinal contours appear normal. The heart is within normal limits in size.  No bony abnormality is seen.  IMPRESSION: No active lung disease.  Original Report Authenticated By: Juline Patch, M.D.   2D echo Study Conclusions  Left ventricle: The cavity size was normal. Systolic function was normal. The estimated ejection fraction was in the range of 60% to 65%. Wall motion was normal; there were no regional wall motion abnormalities. Transthoracic echocardiography.    Medications:  Scheduled Meds:    . acetaminophen  1,000 mg Intravenous Q6H  . aspirin EC  81 mg Oral Daily  .  ceFAZolin (ANCEF) IV  1 g Intravenous 60 min Pre-Op  . chlorhexidine  60 mL Topical Once  . chlorhexidine  60 mL Topical Once  . clonazePAM  0.5 mg Oral TID  . diazepam  5 mg Oral  Once  . docusate sodium  100 mg Oral BID  . Fluticasone-Salmeterol  1 puff Inhalation Q12H  . pantoprazole  40 mg Oral Q1200  . pneumococcal 23 valent vaccine  0.5 mL Intramuscular Once  . senna  1 tablet Oral BID  . sodium chloride  3 mL Intravenous Q12H  . tiotropium  18 mcg Inhalation Daily  . DISCONTD: enoxaparin  40 mg Subcutaneous Q24H  . DISCONTD: ipratropium  0.5 mg Nebulization Q6H  . DISCONTD: tiotropium   18 mcg Inhalation Daily   Continuous Infusions:    . sodium chloride 75 mL/hr at 07/30/11 0000  . sodium chloride 50 mL/hr at 07/30/11 2322  . lactated ringers     PRN Meds:.acetaminophen, acetaminophen, albuterol, alum & mag hydroxide-simeth, fentaNYL, HYDROcodone-acetaminophen, midazolam, ondansetron (ZOFRAN) IV, ondansetron, oxyCODONE, DISCONTD: albuterol, DISCONTD: fentaNYL, DISCONTD: midazolam   Assessment:   #1 right hip fracture, -2-D echo with EF 60-65% wall motion normal, pt chest pain free - cleared for surgery - appreciate ortho assistance #2 asthma/COPD -Stable -continue on her nebulizer treatments  #3 hypertension  -BP stable with lisinopril on hold #4 gastroesophageal reflux disease  -continue Protonix #5Tobacco abuse - nicotine patch, states she wants to quit and encouraged to do so. #6Hypokalemia -recheck in am   LOS: 2 days   Deklen Popelka C 07/31/2011, 7:48 AM

## 2011-07-31 NOTE — Op Note (Signed)
NAMESIMRAT, [name] NO.:  [id number]  MEDICAL RECORD NO.:  [id number]  LOCATION:  2029                         FACILITY:  [facility]  PHYSICIAN:  Dyke Brackett, M.D.    DATE OF BIRTH:  23-Aug-1960  DATE OF PROCEDURE: DATE OF DISCHARGE:                              OPERATIVE REPORT   INDICATIONS:  This is a 51 year old female presented to our office approximately a week ago with a 4-6-week history of hip pain.  She initially had falls.  She had seen a pain management specialist who potentially diagnosed radicular-type complaints, though her MRI was minimally abnormal.  She had actually had a second MRI, which showed slight localization of the left side, but it actually had epidurals. She complained of left lower extremity pain and frequent falls.  She presented to my office, barely able to walk with an antalgic gait and films revealed a displaced femoral neck fracture.  She had a complicated medical history.  It was my feeling that we need to prepare for surgery, so we admitted her approximately 2 days ago for preoperative management for the potential of a hemi versus a total hip replacement on the left side.  PREOPERATIVE DIAGNOSIS:  Displaced femoral neck fracture.  POSTOPERATIVE DIAGNOSIS:  Displaced femoral neck fracture.  OPERATION:  Left total hip replacement (porous coated) size 12 large AML with 32-mm head, +5-mm neck length with 50-mm Sector Gription cup +4 10- degree 32 x 50-mm Pinnacle marathon poly.  SURGEON:  Dyke Brackett, MD  ASSISTANT:  Margart Sickles, PA-C  BLOOD LOSS:  Approximately 300.  ANESTHESIA:  General anesthetic.  DESCRIPTION OF PROCEDURE:  After sterile prep and drape in the lateral position, she had a posterior approach to the hip made with splitting the iliotibial band and short external rotators.  T capsulotomy was made in the hip, immediately entered into the hip.  There was a fair amount of synovial fluid, which appeared to  have a lot of debris, possible debris from joint breakdown, crystal material, and did not have the appearance of gross pus, but there was enough of a question that I sent a stat Gram stain, which showed no organisms and rare monocytes with an extensive synovectomy exposing the acetabulum.  We then took the head and we cut it about 1 fingerbreadth above the lesser trochanter. Progressive reaming and rasping was sized up to a size 12 rasp. Attention was then paid to the acetabulum and we placed an acetabular wing retractors in the superior acetabulum.  I exposed the acetabulum and reamed it up to a 49 to except the 50 cup.  We then placed the final cup with the Gription cup with 3 holes, 100 series, although we did not feel like we needed any external fixation and did not put the screws.  I placed the cup at approximately 50 degrees of abduction and 15-20 degrees of anteversion, then placed a trial liner.  Attention was then directed back to the femur.  We used trial, rasped off the femur.  We eventually settled on a large stature stem.  Bone quality was relatively poor, placed the trial as a broach and then settled on that  size with +5-mm liner.  We then placed a 12-mm large AML porous-coated stem, appropriate amount of version, again trialed off that and settled on the +5-mm neck length.  Stability was excellent.  We could flex her actually to 90-100 degrees and we could adduct her across the table, so just get maximal adduction, internal rotation to 90 before there was any tendency to dislocate.  The leg lengths were approximately equal.  All bony surfaces prior to after reaming and after conclusion of the case, we irrigated the soft tissues, we irrigated with copious irrigation.  Closure was affected with the T capsulotomy, closed with #1 running Ethibond on the fascia lata, and 0 and 2-0 Vicryl with skin clips.  Marcaine with epinephrine into the skin, lightly compressive sterile  dressing, and knee immobilizer applied, taken to the recovery room in stable condition.     Dyke Brackett, M.D.     WDC/MEDQ  D:  07/31/2011  T:  07/31/2011  Job:  [id number]

## 2011-07-31 NOTE — Interval H&P Note (Signed)
History and Physical Interval Note:  07/31/2011 1:46 PM  [name]  has presented today for surgery, with the diagnosis of FRACTURED LEFT HIP  The various methods of treatment have been discussed with the patient and family. After consideration of risks, benefits and other options for treatment, the patient has consented to  Procedure(s) (LRB): TOTAL HIP ARTHROPLASTY (Left) as a surgical intervention .  The patients' history has been reviewed, patient examined, no change in status, stable for surgery.  I have reviewed the patients' chart and labs.  Questions were answered to the patient's satisfaction.     Malisa Ruggiero JR,W D

## 2011-07-31 NOTE — Op Note (Signed)
Dictated # [id number]

## 2011-07-31 NOTE — Transfer of Care (Signed)
Immediate Anesthesia Transfer of Care Note  Patient: [name]  Procedure(s) Performed: Procedure(s) (LRB): TOTAL HIP ARTHROPLASTY (Left)  Patient Location: PACU  Anesthesia Type: General  Level of Consciousness: awake, alert  and oriented  Airway & Oxygen Therapy: Patient Spontanous Breathing and Patient connected to nasal cannula oxygen  Post-op Assessment: Report given to PACU RN and Post -op Vital signs reviewed and stable  Post vital signs: Reviewed and stable  Complications: No apparent anesthesia complications

## 2011-08-01 DIAGNOSIS — S72009A Fracture of unspecified part of neck of unspecified femur, initial encounter for closed fracture: Secondary | ICD-10-CM

## 2011-08-01 DIAGNOSIS — J45909 Unspecified asthma, uncomplicated: Secondary | ICD-10-CM

## 2011-08-01 DIAGNOSIS — K219 Gastro-esophageal reflux disease without esophagitis: Secondary | ICD-10-CM

## 2011-08-01 DIAGNOSIS — Z96649 Presence of unspecified artificial hip joint: Secondary | ICD-10-CM

## 2011-08-01 LAB — BASIC METABOLIC PANEL
CO2: 26 mEq/L (ref 19–32)
Calcium: 8.3 mg/dL — ABNORMAL LOW (ref 8.4–10.5)
GFR calc Af Amer: >90 mL/min (ref >90)
GFR calc non Af Amer: >90 mL/min (ref >90)
Sodium: 140 mEq/L (ref 135–145)

## 2011-08-01 LAB — CBC
MCH: 31.6 pg (ref 26.0–34.0)
Platelets: 88 10*3/uL — ABNORMAL LOW (ref 150–400)
RBC: 2.53 MIL/uL — ABNORMAL LOW (ref 3.87–5.11)
RDW: 15.8 % — ABNORMAL HIGH (ref 11.5–15.5)

## 2011-08-01 LAB — PREPARE RBC (CROSSMATCH)

## 2011-08-01 NOTE — Progress Notes (Signed)
Subjective: Status post surgery, denies any complaints. No nausea vomiting no chest pain. No gross bleeding. Family at bedside.  Objective: Vital signs in last 24 hours: Filed Vitals:   07/31/11 2218 08/01/11 0015 08/01/11 0400 08/01/11 0402  BP: 119/78 108/70  99/65  Pulse: 107 100  93  Temp: 98.8 F (37.1 C) 98.9 F (37.2 C)  98.4 F (36.9 C)  TempSrc: Oral Oral  Oral  Resp: 18 18 18 17   SpO2: 97% 93% 96% 96%    Intake/Output Summary (Last 24 hours) at 08/01/11 1610 Last data filed at 07/31/11 2219  Gross per 24 hour  Intake   2660 ml  Output    950 ml  Net   1710 ml    Weight change:   General: Alert & oriented x3, in no distress, in good spirits Cardiovascular: Normal rate, regular rhythm and intact distal pulses. Exam reveals no gallop and no friction rub.  No murmur heard.  Pulmonary/Chest: Effort normal and breath sounds normal. She has no wheezes. no crackles.  Abdominal: Soft. no distension,NT, no guarding.  Extremities: No cyanosis, no edema, LLE in immobilizer Skin: Skin is warm and dry.  Psychiatric: She has a normal mood and affect.     Lab Results: Results for orders placed during the hospital encounter of 07/29/11 (from the past 24 hour(s))  BODY FLUID CULTURE     Status: Normal (Preliminary result)   Collection Time   07/31/11  2:34 PM      Component Value Range   Specimen Description FLUID SYNOVIAL HIP LEFT     Special Requests ON SWAB PT ON ANCEF     Gram Stain       Value: RARE WBC PRESENT, PREDOMINANTLY MONONUCLEAR     NO ORGANISMS SEEN     Gram Stain Report Called to,Read Back By and Verified With: Gram Stain Report Called to,Read Back By and Verified With: OR 07/31/11 1515 BY K SCHULTZ Performed at [facility]   Culture PENDING     Report Status PENDING    ANAEROBIC CULTURE     Status: Normal (Preliminary result)   Collection Time   07/31/11  2:34 PM      Component Value Range   Specimen Description FLUID SYNOVIAL HIP LEFT     Special  Requests ON SWAB PT ON ANCEF     Gram Stain       Value: RARE WBC PRESENT, PREDOMINANTLY MONONUCLEAR     NO ORGANISMS SEEN     Gram Stain Report Called to,Read Back By and Verified With: Gram Stain Report Called to,Read Back By and Verified With: OR 07/31/11 1515 BY K SCHULTZ Performed at [facility]   Culture PENDING     Report Status PENDING    GRAM STAIN     Status: Normal   Collection Time   07/31/11  2:34 PM      Component Value Range   Specimen Description FLUID SYNOVIAL HIP LEFT     Special Requests ON SWAB PT ON ANCEF     Gram Stain       Value: RARE WBC PRESENT, PREDOMINANTLY MONONUCLEAR     NO ORGANISMS SEEN     CALLED TO OR 07/31/11 1515 BY K SCHULTZ   Report Status 07/31/2011 FINAL    TISSUE CULTURE     Status: Normal (Preliminary result)   Collection Time   07/31/11  2:52 PM      Component Value Range   Specimen Description TISSUE HIP  LEFT     Special Requests NONE     Gram Stain       Value: FEW WBC PRESENT,BOTH PMN AND MONONUCLEAR     NO ORGANISMS SEEN     Performed at [facility]   Culture NO GROWTH 1 DAY     Report Status PENDING    ANAEROBIC CULTURE     Status: Normal (Preliminary result)   Collection Time   07/31/11  2:52 PM      Component Value Range   Specimen Description TISSUE HIP LEFT     Special Requests NONE     Gram Stain       Value: FEW WBC PRESENT,BOTH PMN AND MONONUCLEAR     NO ORGANISMS SEEN     Performed at [facility]   Culture PENDING     Report Status PENDING    GRAM STAIN     Status: Normal   Collection Time   07/31/11  2:52 PM      Component Value Range   Specimen Description TISSUE HIP LEFT     Special Requests NONE     Gram Stain       Value: FEW WBC PRESENT,BOTH PMN AND MONONUCLEAR     NO ORGANISMS SEEN   Report Status 07/31/2011 FINAL    CBC     Status: Abnormal   Collection Time   08/01/11  6:05 AM      Component Value Range   WBC 6.6  4.0 - 10.5 (K/uL)   RBC 2.53 (*) 3.87 - 5.11 (MIL/uL)   Hemoglobin 8.0  (*) 12.0 - 15.0 (g/dL)   HCT 40.9 (*) 81.1 - 46.0 (%)   MCV 95.3  78.0 - 100.0 (fL)   MCH 31.6  26.0 - 34.0 (pg)   MCHC 33.2  30.0 - 36.0 (g/dL)   RDW 91.4 (*) 78.2 - 15.5 (%)   Platelets 88 (*) 150 - 400 (K/uL)     Micro: Recent Results (from the past 240 hour(s))  SURGICAL PCR SCREEN     Status: Normal   Collection Time   07/30/11  5:07 AM      Component Value Range Status Comment   MRSA, PCR NEGATIVE  NEGATIVE  Final    Staphylococcus aureus NEGATIVE  NEGATIVE  Final   BODY FLUID CULTURE     Status: Normal (Preliminary result)   Collection Time   07/31/11  2:34 PM      Component Value Range Status Comment   Specimen Description FLUID SYNOVIAL HIP LEFT   Final    Special Requests ON SWAB PT ON ANCEF   Final    Gram Stain     Final    Value: RARE WBC PRESENT, PREDOMINANTLY MONONUCLEAR     NO ORGANISMS SEEN     Gram Stain Report Called to,Read Back By and Verified With: Gram Stain Report Called to,Read Back By and Verified With: OR 07/31/11 1515 BY K SCHULTZ Performed at [facility]   Culture PENDING   Incomplete    Report Status PENDING   Incomplete   ANAEROBIC CULTURE     Status: Normal (Preliminary result)   Collection Time   07/31/11  2:34 PM      Component Value Range Status Comment   Specimen Description FLUID SYNOVIAL HIP LEFT   Final    Special Requests ON SWAB PT ON ANCEF   Final    Gram Stain     Final    Value: RARE WBC  PRESENT, PREDOMINANTLY MONONUCLEAR     NO ORGANISMS SEEN     Gram Stain Report Called to,Read Back By and Verified With: Gram Stain Report Called to,Read Back By and Verified With: OR 07/31/11 1515 BY K SCHULTZ Performed at [facility]   Culture PENDING   Incomplete    Report Status PENDING   Incomplete   GRAM STAIN     Status: Normal   Collection Time   07/31/11  2:34 PM      Component Value Range Status Comment   Specimen Description FLUID SYNOVIAL HIP LEFT   Final    Special Requests ON SWAB PT ON ANCEF   Final    Gram Stain     Final     Value: RARE WBC PRESENT, PREDOMINANTLY MONONUCLEAR     NO ORGANISMS SEEN     CALLED TO OR 07/31/11 1515 BY K SCHULTZ   Report Status 07/31/2011 FINAL   Final   TISSUE CULTURE     Status: Normal (Preliminary result)   Collection Time   07/31/11  2:52 PM      Component Value Range Status Comment   Specimen Description TISSUE HIP LEFT   Final    Special Requests NONE   Final    Gram Stain     Final    Value: FEW WBC PRESENT,BOTH PMN AND MONONUCLEAR     NO ORGANISMS SEEN     Performed at [facility]   Culture NO GROWTH 1 DAY   Final    Report Status PENDING   Incomplete   ANAEROBIC CULTURE     Status: Normal (Preliminary result)   Collection Time   07/31/11  2:52 PM      Component Value Range Status Comment   Specimen Description TISSUE HIP LEFT   Final    Special Requests NONE   Final    Gram Stain     Final    Value: FEW WBC PRESENT,BOTH PMN AND MONONUCLEAR     NO ORGANISMS SEEN     Performed at [facility]   Culture PENDING   Incomplete    Report Status PENDING   Incomplete   GRAM STAIN     Status: Normal   Collection Time   07/31/11  2:52 PM      Component Value Range Status Comment   Specimen Description TISSUE HIP LEFT   Final    Special Requests NONE   Final    Gram Stain     Final    Value: FEW WBC PRESENT,BOTH PMN AND MONONUCLEAR     NO ORGANISMS SEEN   Report Status 07/31/2011 FINAL   Final     Studies/Results: Dg Pelvis Portable  07/31/2011  *RADIOLOGY REPORT*  Clinical Data: Status post left hip hemiarthroplasty  PORTABLE PELVIS  Comparison: 05/06/2011  Findings: Satisfactory appearance status post left hip arthroplasty.  Associated subcutaneous gas.  Overlying skin staples.  IMPRESSION: Satisfactory appearance status post left hip arthroplasty.  Original Report Authenticated By: Charline Bills, M.D.   Dg Chest Portable 1 View  07/30/2011  *RADIOLOGY REPORT*  Clinical Data: Preoperative respiratory exam for left hip fracture.  CHEST - 1 VIEW   Comparison:  07/29/2011  Findings: The heart size and mediastinal contours are within normal limits.  Both lungs are clear.  IMPRESSION: No active disease.  Original Report Authenticated By: Reola Calkins, M.D.   Dg Hip Portable 1 View Left  07/31/2011  *RADIOLOGY REPORT*  Clinical Data: Postop left hip  hemiarthroplasty  PORTABLE LEFT HIP - 1 VIEW  Comparison: 05/06/2011  Findings: Status post left hip arthroplasty in near anatomic alignment and position.  Associated subcutaneous gas.  Overlying skin staples.  No fracture or dislocation is seen.  IMPRESSION: Status post left hip arthroplasty in near anatomic alignment and position.  Original Report Authenticated By: Charline Bills, M.D.   2D echo Study Conclusions  Left ventricle: The cavity size was normal. Systolic function was normal. The estimated ejection fraction was in the range of 60% to 65%. Wall motion was normal; there were no regional wall motion abnormalities. Transthoracic echocardiography.    Medications:  Scheduled Meds:    . acetaminophen  1,000 mg Intravenous Q6H  .  ceFAZolin (ANCEF) IV  1 g Intravenous 60 min Pre-Op  .  ceFAZolin (ANCEF) IV  1 g Intravenous Q6H  . clonazePAM  0.5 mg Oral TID  . diazepam  5 mg Oral Once  . docusate sodium  100 mg Oral BID  . enoxaparin  30 mg Subcutaneous Q12H  . Fluticasone-Salmeterol  1 puff Inhalation Q12H  . nicotine  14 mg Transdermal Daily  . pantoprazole  40 mg Oral Q1200  . senna  1 tablet Oral BID  . sodium chloride  3 mL Intravenous Q12H  . tiotropium  18 mcg Inhalation Daily  . DISCONTD: acetaminophen  1,000 mg Intravenous Q6H  . DISCONTD: aspirin EC  81 mg Oral Daily  . DISCONTD: pneumococcal 23 valent vaccine  0.5 mL Intramuscular Once   Continuous Infusions:    . sodium chloride 75 mL/hr at 07/31/11 1907  . DISCONTD: sodium chloride 75 mL/hr at 07/30/11 0000  . DISCONTD: sodium chloride 50 mL/hr at 07/30/11 2322  . DISCONTD: lactated ringers 50 mL/hr at  07/31/11 1137   PRN Meds:.acetaminophen, acetaminophen, albuterol, alum & mag hydroxide-simeth, HYDROmorphone (DILAUDID) injection, menthol-cetylpyridinium, methocarbamol (ROBAXIN) IV, methocarbamol, metoCLOPramide, ondansetron (ZOFRAN) IV, ondansetron, oxyCODONE, phenol, DISCONTD: bupivacaine-EPINEPHrine, DISCONTD: fentaNYL, DISCONTD: fentaNYL, DISCONTD: HYDROcodone-acetaminophen, DISCONTD:  HYDROmorphone (DILAUDID) injection, DISCONTD:  HYDROmorphone (DILAUDID) injection DISCONTD: midazolam, DISCONTD: ondansetron (ZOFRAN) IV, DISCONTD: oxyCODONE, DISCONTD: sodium chloride irrigation   Assessment:   #1 right hip fracture, -2-D echo with EF 60-65% wall motion normal, pt chest pain free - cleared for surgery - Status post left total hip replacement, also to consult PT OT when appropriate-appreciate the assistance. #2 asthma/COPD -Stable -continue on her nebulizer treatments  #3 hypertension  -Borderline BP today, continue to monitor BPs and hold off lisinopril #4 gastroesophageal reflux disease  -continue Protonix #5Tobacco abuse - continue nicotine patch #6 postop anemia -Follow recheck and transfuse as appropriate. #7 thrombocytopenia/? Chronic - was 92 on admission, No gross bleeding. - follow and recheck  LOS: 3 days   Nichalas Coin C 08/01/2011, 7:42 AM

## 2011-08-01 NOTE — Evaluation (Signed)
Physical Therapy Evaluation Patient Details Name: [name] MRN: [id number] DOB: 1961-01-06 Today's Date: 08/01/2011 Time: 4098-1191 PT Time Calculation (min): 31 min  PT Assessment / Plan / Recommendation Clinical Impression  Pt admitted after several falls and home and found to have left femoral neck fx s/p THA. Pt moving very well and highly motivated to discharge home soon with family. Pt does not recall falls at home. Pt and family educated for precautions with sheet provided. HR up to 140 with ambulation in hall Dr.Cafffrey present and aware. Pt will benefit from acute therapy to maximize transfers, gait and mobility prior to discharge to decrease burden of care and return pt to PLOF.     PT Assessment  Patient needs continued PT services    Follow Up Recommendations  Home health PT    Barriers to Discharge None      lEquipment Recommendations  None recommended by PT    Recommendations for Other Services OT consult   Frequency 7X/week    Precautions / Restrictions Precautions Precautions: Posterior Hip;Fall Precaution Booklet Issued: Yes (comment)   Pertinent Vitals/Pain No pain      Mobility  Bed Mobility Bed Mobility: Supine to Sit;Sitting - Scoot to Edge of Bed Supine to Sit: 4: Min assist;HOB flat;With rails Sitting - Scoot to Edge of Bed: 5: Supervision Details for Bed Mobility Assistance: cueing for sequence to pivot legs to EOB maintaining precautions and elevate trunk with min assist Transfers Transfers: Sit to Stand;Stand to Sit Sit to Stand: 4: Min guard;From bed Stand to Sit: 4: Min guard;To chair/3-in-1;With armrests Details for Transfer Assistance: cueing for left foot and hand placement for sequence with precautions Ambulation/Gait Ambulation/Gait Assistance: 4: Min guard Ambulation Distance (Feet): 80 Feet Assistive device: Rolling walker Ambulation/Gait Assistance Details: cueing for sequence and precautions Gait Pattern: Step-to  pattern Stairs: No    Exercises Total Joint Exercises Heel Slides: AROM;Left;5 reps;Supine   PT Diagnosis: Difficulty walking;Abnormality of gait  PT Problem List: Decreased activity tolerance;Decreased knowledge of precautions;Decreased knowledge of use of DME PT Treatment Interventions: DME instruction;Gait training;Functional mobility training;Therapeutic activities;Patient/family education   PT Goals Acute Rehab PT Goals PT Goal Formulation: With patient/family Time For Goal Achievement: 08/08/11 Potential to Achieve Goals: Good Pt will go Supine/Side to Sit: with modified independence PT Goal: Supine/Side to Sit - Progress: Goal set today Pt will go Sit to Supine/Side: with modified independence PT Goal: Sit to Supine/Side - Progress: Goal set today Pt will go Sit to Stand: with modified independence PT Goal: Sit to Stand - Progress: Goal set today Pt will go Stand to Sit: with modified independence PT Goal: Stand to Sit - Progress: Goal set today Pt will Ambulate: >150 feet;with modified independence;with least restrictive assistive device PT Goal: Ambulate - Progress: Goal set today Additional Goals Additional Goal #1: Pt will independently state and demonstrate adherence to 3/3 hip precautions PT Goal: Additional Goal #1 - Progress: Goal set today  Visit Information  Last PT Received On: 08/01/11 Assistance Needed: +1    Subjective Data  Subjective: I've been walking on it broken for 2 weeks Patient Stated Goal: go home monday   Prior Functioning  Home Living Lives With: Alone Available Help at Discharge: Family;Available 24 hours/day Type of Home: House Home Access: Stairs to enter [employer] of Steps: son is building a ramp before discharge Home Layout: One level Bathroom Shower/Tub: Engineer, manufacturing systems: Standard Home Adaptive Equipment: Bedside commode/3-in-1;Walker - rolling;Shower chair with back Prior Function Level  of  Independence: Independent Able to Take Stairs?: Yes Driving: Yes Comments: Pt states she was independent until 2wks ago and since hip pain and fx has required assist from family for bathing, dressing and chores at home Communication Communication: No difficulties    Cognition  Overall Cognitive Status: Appears within functional limits for tasks assessed/performed Arousal/Alertness: Awake/alert Orientation Level: Appears intact for tasks assessed Behavior During Session: [facility] for tasks performed    Extremity/Trunk Assessment Right Lower Extremity Assessment RLE ROM/Strength/Tone: [facility] for tasks assessed Left Lower Extremity Assessment LLE ROM/Strength/Tone: Deficits LLE ROM/Strength/Tone Deficits: limited by post op precautions, grossly 3/5   Balance    End of Session PT - End of Session Equipment Utilized During Treatment: Gait belt Activity Tolerance: Patient tolerated treatment well Patient left: in chair;with call bell/phone within reach;with family/visitor present Nurse Communication: Mobility status   Delorse Lek 08/01/2011, 10:35 AM  Delaney Meigs, PT [phone]

## 2011-08-01 NOTE — Progress Notes (Signed)
Physical Therapy Note   08/01/11 1612  PT Visit Information  Last PT Received On 08/01/11  Assistance Needed +1  PT Time Calculation  PT Start Time 1541  PT Stop Time 1611  PT Time Calculation (min) 30 min  Subjective Data  Subjective "I walked on my leg broken for a month"  Precautions  Precautions Posterior Hip;Fall  Restrictions  Weight Bearing Restrictions Yes  LLE Weight Bearing WBAT  Cognition  Overall Cognitive Status Appears within functional limits for tasks assessed/performed  Arousal/Alertness Awake/alert  Orientation Level Appears intact for tasks assessed  Behavior During Session [facility] for tasks performed  Bed Mobility  Bed Mobility Supine to Sit;Sitting - Scoot to [place] of Bed;Sit to Supine  Supine to Sit 4: Min assist;With rails;HOB flat  Sitting - Scoot to [place] of Bed 5: Supervision  Sit to Supine 4: Min assist;With rail;HOB flat  Details for Bed Mobility Assistance Cueing for technique and to maintain precautions  Transfers  Transfers Sit to Stand;Stand to Sit  Sit to Stand 4: Min guard;With upper extremity assist;From bed  Stand to Sit 4: Min guard;With upper extremity assist;To bed  Details for Transfer Assistance Cueing for hand and LLE placement  Ambulation/Gait  Ambulation/Gait Assistance 4: Min guard  Ambulation Distance (Feet) 86 Feet  Assistive device Rolling walker  Gait Pattern Step-to pattern  Gait velocity Slow gait speed  General Gait Details Cueing for sequence during turns.  PT - End of Session  Equipment Utilized During Treatment Gait belt;Left knee immobilizer  Activity Tolerance Patient tolerated treatment well  Patient left in bed;with call bell/phone within reach  Nurse Communication Mobility status  PT - Assessment/Plan  Comments on Treatment Session Patient receiving second unit of blood.  No dizziness during session.  Did very well with mobility/gait.  Able to recall 2/3 hip precautions.  PT Plan Discharge plan remains  appropriate;Frequency remains appropriate  PT Frequency 7X/week  Follow Up Recommendations Home health PT  Equipment Recommended 3 in 1 bedside comode  Acute Rehab PT Goals  PT Goal: Supine/Side to Sit - Progress Progressing toward goal  PT Goal: Sit to Supine/Side - Progress Progressing toward goal  PT Goal: Sit to Stand - Progress Progressing toward goal  PT Goal: Stand to Sit - Progress Progressing toward goal  PT Goal: Ambulate - Progress Progressing toward goal  Additional Goals  PT Goal: Additional Goal #1 - Progress Progressing toward goal  PT General Charges  $$ ACUTE PT VISIT 1 Procedure  PT Treatments  $Gait Training 23-37 mins   Durenda Hurt. Renaldo Fiddler, [facility] Acute Rehab Services Pager [phone]

## 2011-08-01 NOTE — Progress Notes (Signed)
CSW consulted for SNF. PT recommendation for HHPT noted. CSW signing off as no other CSW needs identified. Please re-consult if SNF needed. Dantavious Snowball, MSW, LCSWA [phone] (weekend) 

## 2011-08-01 NOTE — Evaluation (Signed)
Occupational Therapy Evaluation Patient Details Name: JANESHIA CILIBERTO MRN: [id number] DOB: 1960/11/29 Today's Date: 08/01/2011 Time: 1478-2956 OT Time Calculation (min): 25 min  OT Assessment / Plan / Recommendation Clinical Impression       OT Assessment  Patient needs continued OT Services    Follow Up Recommendations  Supervision/Assistance - 24 hour;No OT follow up    Barriers to Discharge None    Equipment Recommendations  3 in 1 bedside comode    Recommendations for Other Services    Frequency  Min 2X/week    Precautions / Restrictions Precautions Precautions: Posterior Hip;Fall Precaution Booklet Issued: Yes (comment) Restrictions Weight Bearing Restrictions: No       ADL  Eating/Feeding: Simulated;Independent Where Assessed - Eating/Feeding: Chair Grooming: Simulated;Wash/dry hands;Wash/dry face;Set up Where Assessed - Grooming: Supported sitting Upper Body Bathing: Simulated;Supervision/safety Where Assessed - Upper Body Bathing: Supported sitting Lower Body Bathing: Simulated;Moderate assistance Where Assessed - Lower Body Bathing: Unsupported sit to stand Upper Body Dressing: Simulated;Supervision/safety Where Assessed - Upper Body Dressing: Unsupported sitting Lower Body Dressing: Simulated;Performed;Minimal assistance (using AE) Where Assessed - Lower Body Dressing: Unsupported sit to stand Toilet Transfer: Simulated;Minimal assistance Toilet Transfer Method: Sit to stand Toilet Transfer Equipment: Raised toilet seat with arms (or 3-in-1 over toilet) Toileting - Clothing Manipulation and Hygiene: Simulated;Minimal assistance Where Assessed - Glass blower/designer Manipulation and Hygiene: Standing Equipment Used: Reacher;Sock aid;Rolling walker;Long-handled shoe horn;Long-handled sponge ADL Comments: pt. instructed in use of AE for LB ADLs.  She is able to recall 2/3 hip precautions.  Pt. requires min cues for use of AE.      OT Diagnosis: Generalized  weakness  OT Problem List: Decreased strength;Decreased activity tolerance;Impaired balance (sitting and/or standing);Decreased knowledge of use of DME or AE;Decreased knowledge of precautions OT Treatment Interventions: Self-care/ADL training;Therapeutic activities;DME and/or AE instruction;Patient/family education   OT Goals Acute Rehab OT Goals OT Goal Formulation: With patient Time For Goal Achievement: 08/08/11 Potential to Achieve Goals: Good ADL Goals Pt Will Perform Grooming: with supervision;Standing at sink ADL Goal: Grooming - Progress: Goal set today Pt Will Perform Lower Body Bathing: with supervision;Sit to stand from bed;Sit to stand from chair ADL Goal: Lower Body Bathing - Progress: Goal set today Pt Will Perform Lower Body Dressing: with supervision;with adaptive equipment;Sit to stand from bed;Sit to stand from chair ADL Goal: Lower Body Dressing - Progress: Goal set today Pt Will Transfer to Toilet: with supervision;3-in-1 ADL Goal: Toilet Transfer - Progress: Goal set today Pt Will Perform Toileting - Hygiene: with modified independence;Sit to stand from 3-in-1/toilet ADL Goal: Toileting - Hygiene - Progress: Goal set today Pt Will Perform Tub/Shower Transfer: Tub transfer;with supervision;Shower seat with back;Ambulation ADL Goal: Tub/Shower Transfer - Progress: Goal set today  Visit Information  Last OT Received On: 08/01/11 Assistance Needed: +1    Subjective Data  Subjective: "My grandson will help me with my socks if I need it" Patient Stated Goal: To go home   Prior Functioning  Home Living Lives With: Alone Available Help at Discharge: Family;Available 24 hours/day Type of Home: House Home Access: Stairs to enter [employer] of Steps: son is building a ramp before discharge Home Layout: One level Bathroom Shower/Tub: Engineer, manufacturing systems: Standard Home Adaptive Equipment: Bedside commode/3-in-1;Walker - rolling;Shower chair  with back Prior Function Level of Independence: Independent Able to Take Stairs?: Yes Driving: Yes Comments: Pt states she was independent until 2wks ago and since hip pain and fx has required assist from family for bathing, dressing and  chores at home Communication Communication: No difficulties Dominant Hand: Right    Cognition  Overall Cognitive Status: Appears within functional limits for tasks assessed/performed Arousal/Alertness: Awake/alert Orientation Level: Appears intact for tasks assessed Behavior During Session: [facility] for tasks performed    Extremity/Trunk Assessment Right Upper Extremity Assessment RUE ROM/Strength/Tone: Within functional levels RUE Coordination: WFL - gross/fine motor Left Upper Extremity Assessment LUE ROM/Strength/Tone: Within functional levels LUE Coordination: WFL - gross/fine motor          Balance    End of Session OT - End of Session Activity Tolerance: Patient tolerated treatment well Patient left: in chair;with call bell/phone within reach;with family/visitor present   Twilia Yaklin, Ursula Alert M 08/01/2011, 1:53 PM

## 2011-08-01 NOTE — Progress Notes (Signed)
Subjective: 1 Day Post-Op Procedure(s) (LRB): TOTAL HIP ARTHROPLASTY (Left) Patient reports pain as moderate.    Objective: Vital signs in last 24 hours: Temp:  [98.3 F (36.8 C)-98.9 F (37.2 C)] 98.7 F (37.1 C) (06/08 0819) Pulse Rate:  [8-108] 108  (06/08 0819) Resp:  [10-27] 18  (06/08 0819) BP: (97-121)/(63-78) 97/63 mmHg (06/08 0819) SpO2:  [92 %-100 %] 92 % (06/08 0819)  Intake/Output from previous day: 06/07 0701 - 06/08 0700 In: 2900 [P.O.:600; I.V.:1800; IV Piggyback:500] Out: 950 [Urine:650; Blood:300] Intake/Output this shift:     Basename 08/01/11 0605 07/30/11 0907 07/30/11 0600 07/29/11 1710  HGB 8.0* 11.9* 11.9* 12.7    Basename 08/01/11 0605 07/30/11 0907  WBC 6.6 4.7  RBC 2.53* 3.76*  HCT 24.1* 36.5  PLT 88* 96*    Basename 08/01/11 0605 07/30/11 0907  NA 140 143  K 4.1 3.1*  CL 104 105  CO2 26 29  BUN 11 13  CREATININE 0.72 0.70  GLUCOSE 133* 109*  CALCIUM 8.3* 9.0    Basename 07/30/11 0907 07/29/11 1710  LABPT -- --  INR 1.10 1.14    Neurovascular intact  Assessment/Plan: 1 Day Post-Op Procedure(s) (LRB): TOTAL HIP ARTHROPLASTY (Left) Up with therapy Transfuse 2 units,transfer to 5000 Carlas Vandyne JR,W D 08/01/2011, 9:01 AM

## 2011-08-02 DIAGNOSIS — S72009A Fracture of unspecified part of neck of unspecified femur, initial encounter for closed fracture: Secondary | ICD-10-CM

## 2011-08-02 DIAGNOSIS — M311 Thrombotic microangiopathy: Secondary | ICD-10-CM

## 2011-08-02 DIAGNOSIS — J45909 Unspecified asthma, uncomplicated: Secondary | ICD-10-CM

## 2011-08-02 DIAGNOSIS — Z96649 Presence of unspecified artificial hip joint: Secondary | ICD-10-CM

## 2011-08-02 LAB — CBC
HCT: 28.6 % — ABNORMAL LOW (ref 36.0–46.0)
MCHC: 33.9 g/dL (ref 30.0–36.0)
Platelets: 66 10*3/uL — ABNORMAL LOW (ref 150–400)
RDW: 17.7 % — ABNORMAL HIGH (ref 11.5–15.5)
WBC: 6.9 10*3/uL (ref 4.0–10.5)

## 2011-08-02 LAB — TYPE AND SCREEN
ABO/RH(D): A POS
Antibody Screen: NEGATIVE
Unit division: 0

## 2011-08-02 LAB — BASIC METABOLIC PANEL
Chloride: 105 mEq/L (ref 96–112)
GFR calc Af Amer: >90 mL/min (ref >90)
GFR calc non Af Amer: >90 mL/min (ref >90)
Potassium: 3.8 mEq/L (ref 3.5–5.1)
Sodium: 139 mEq/L (ref 135–145)

## 2011-08-02 MED ORDER — COUMADIN BOOK
Freq: Once | Status: AC
  Administered 2011-08-02: 12:00:00
  Filled 2011-08-02: qty 1

## 2011-08-02 MED ORDER — HYDROCODONE-ACETAMINOPHEN 10-325 MG PO TABS: 1.0000 | ORAL_TABLET | ORAL | Status: AC | PRN

## 2011-08-02 MED ORDER — WARFARIN SODIUM 7.5 MG PO TABS
7.5000 mg | ORAL_TABLET | Freq: Once | ORAL | Status: AC
  Administered 2011-08-02: 7.5 mg via ORAL
  Filled 2011-08-02 (×2): qty 1

## 2011-08-02 MED ORDER — WARFARIN - PHARMACIST DOSING INPATIENT: Freq: Every day | Status: DC

## 2011-08-02 MED ORDER — HYDROCODONE-ACETAMINOPHEN 10-325 MG PO TABS: 1.0000 | ORAL_TABLET | ORAL | Status: DC | PRN

## 2011-08-02 MED ORDER — WARFARIN VIDEO: Freq: Once | Status: DC

## 2011-08-02 MED ORDER — HYDROCODONE-ACETAMINOPHEN 10-325 MG PO TABS
1.0000 | ORAL_TABLET | ORAL | Status: DC | PRN
  Administered 2011-08-02 (×3): 1 via ORAL
  Filled 2011-08-02 (×3): qty 1

## 2011-08-02 NOTE — Progress Notes (Signed)
   CARE MANAGEMENT NOTE 08/02/2011  Patient:  [name], [name]   Account Number:  [id number]  Date Initiated:  07/30/2011  Documentation initiated by:  [facility]  Subjective/Objective Assessment:   Admitted with hip fx  Has daughter and brother.  Lives in [place], [place].     Action/Plan:   Anticipated DC Date:  08/06/2011   Anticipated DC Plan:  HOME W HOME HEALTH SERVICES      DC Planning Services  CM consult      [facility] Choice  HOME HEALTH   Choice offered to / List presented to:  C-1 Patient           [facility] agency  [facility]   Status of service:  In process, will continue to follow Medicare Important Message given?   (If response is "NO", the following Medicare IM given date fields will be blank) Date Medicare IM given:   Date Additional Medicare IM given:    Discharge Disposition:    Per UR Regulation:  Reviewed for med. necessity/level of care/duration of stay  If discussed at Long Length of Stay Meetings, dates discussed:    Comments:  08/02/2011 1300 Spoke to pt and she is requesting a tub bench for home. Will leave message for MD to write order for DME. Will have [facility] for [facility] at d/c. Waiting order to set up [facility]. Isidoro Donning RN CCM Case Mgmt phone [phone]  Daughter Bernardo Heater - [phone]  07-30-11 12:20 Avie Arenas, RNBSN [id number] Patient in bed - appears sleepy due to droopy eyes and slow speech.  phone ringing - did not acknowlege hearing it till shown her phone was ringing. Answered phone - will come back to see. 2pm spoke with patient.  has RW, WC and BSC at home.  Would like rollator on discharge. has niece that is NA who would like to care for her on discharge.  Will continue to follow for further needs.

## 2011-08-02 NOTE — Progress Notes (Signed)
Subjective: Denies any c/o today. Pt wants to be put back on vicodin(does not want the oxycodone). Denies gross bleeding. Family at bedside.  Objective: Vital signs in last 24 hours: Filed Vitals:   08/01/11 2018 08/01/11 2345 08/02/11 0400 08/02/11 0433  BP: 116/75 112/75  101/66  Pulse: 102 112  102  Temp: 98.6 F (37 C) 98.5 F (36.9 C)  98.9 F (37.2 C)  TempSrc: Oral Oral  Oral  Resp: 20 18  16   SpO2: 98% 94% 94% 96%    Intake/Output Summary (Last 24 hours) at 08/02/11 0739 Last data filed at 08/02/11 0600  Gross per 24 hour  Intake 3341.25 ml  Output   3050 ml  Net 291.25 ml    Weight change:   General: Alert & oriented x3, in no distress, in good spirits Cardiovascular: Normal rate, regular rhythm and intact distal pulses. Exam reveals no gallop and no friction rub.  No murmur heard.  Pulmonary/Chest: Effort normal and breath sounds normal. She has no wheezes. no crackles.  Abdominal: Soft. no distension,NT, no guarding.  Extremities: No cyanosis, no edema, LLE in immobilizer Skin: Skin is warm and dry.  Psychiatric: She has a normal mood and affect.     Lab Results: Results for orders placed during the hospital encounter of 07/29/11 (from the past 24 hour(s))  PREPARE RBC (CROSSMATCH)     Status: Normal   Collection Time   08/01/11  9:10 AM      Component Value Range   Order Confirmation ORDER PROCESSED BY BLOOD BANK    CBC     Status: Abnormal   Collection Time   08/02/11  5:40 AM      Component Value Range   WBC 6.9  4.0 - 10.5 (K/uL)   RBC 3.10 (*) 3.87 - 5.11 (MIL/uL)   Hemoglobin 9.7 (*) 12.0 - 15.0 (g/dL)   HCT 56.2 (*) 13.0 - 46.0 (%)   MCV 92.3  78.0 - 100.0 (fL)   MCH 31.3  26.0 - 34.0 (pg)   MCHC 33.9  30.0 - 36.0 (g/dL)   RDW 86.5 (*) 78.4 - 15.5 (%)   Platelets 66 (*) 150 - 400 (K/uL)     Micro: Recent Results (from the past 240 hour(s))  SURGICAL PCR SCREEN     Status: Normal   Collection Time   07/30/11  5:07 AM      Component Value  Range Status Comment   MRSA, PCR NEGATIVE  NEGATIVE  Final    Staphylococcus aureus NEGATIVE  NEGATIVE  Final   BODY FLUID CULTURE     Status: Normal (Preliminary result)   Collection Time   07/31/11  2:34 PM      Component Value Range Status Comment   Specimen Description FLUID SYNOVIAL HIP LEFT   Final    Special Requests ON SWAB PT ON ANCEF   Final    Gram Stain     Final    Value: RARE WBC PRESENT, PREDOMINANTLY MONONUCLEAR     NO ORGANISMS SEEN     Gram Stain Report Called to,Read Back By and Verified With: Gram Stain Report Called to,Read Back By and Verified With: OR 07/31/11 1515 BY K SCHULTZ Performed at [facility]   Culture NO GROWTH   Final    Report Status PENDING   Incomplete   ANAEROBIC CULTURE     Status: Normal (Preliminary result)   Collection Time   07/31/11  2:34 PM      Component  Value Range Status Comment   Specimen Description FLUID SYNOVIAL HIP LEFT   Final    Special Requests ON SWAB PT ON ANCEF   Final    Gram Stain     Final    Value: RARE WBC PRESENT, PREDOMINANTLY MONONUCLEAR     NO ORGANISMS SEEN     Gram Stain Report Called to,Read Back By and Verified With: Gram Stain Report Called to,Read Back By and Verified With: OR 07/31/11 1515 BY K SCHULTZ Performed at [facility]   Culture     Final    Value: NO ANAEROBES ISOLATED; CULTURE IN PROGRESS FOR 5 DAYS   Report Status PENDING   Incomplete   GRAM STAIN     Status: Normal   Collection Time   07/31/11  2:34 PM      Component Value Range Status Comment   Specimen Description FLUID SYNOVIAL HIP LEFT   Final    Special Requests ON SWAB PT ON ANCEF   Final    Gram Stain     Final    Value: RARE WBC PRESENT, PREDOMINANTLY MONONUCLEAR     NO ORGANISMS SEEN     CALLED TO OR 07/31/11 1515 BY K SCHULTZ   Report Status 07/31/2011 FINAL   Final   TISSUE CULTURE     Status: Normal (Preliminary result)   Collection Time   07/31/11  2:52 PM      Component Value Range Status Comment   Specimen Description  TISSUE HIP LEFT   Final    Special Requests NONE   Final    Gram Stain     Final    Value: FEW WBC PRESENT,BOTH PMN AND MONONUCLEAR     NO ORGANISMS SEEN     Performed at [facility]   Culture NO GROWTH 1 DAY   Final    Report Status PENDING   Incomplete   ANAEROBIC CULTURE     Status: Normal (Preliminary result)   Collection Time   07/31/11  2:52 PM      Component Value Range Status Comment   Specimen Description TISSUE HIP LEFT   Final    Special Requests NONE   Final    Gram Stain     Final    Value: FEW WBC PRESENT,BOTH PMN AND MONONUCLEAR     NO ORGANISMS SEEN     Performed at [facility]   Culture     Final    Value: NO ANAEROBES ISOLATED; CULTURE IN PROGRESS FOR 5 DAYS   Report Status PENDING   Incomplete   GRAM STAIN     Status: Normal   Collection Time   07/31/11  2:52 PM      Component Value Range Status Comment   Specimen Description TISSUE HIP LEFT   Final    Special Requests NONE   Final    Gram Stain     Final    Value: FEW WBC PRESENT,BOTH PMN AND MONONUCLEAR     NO ORGANISMS SEEN   Report Status 07/31/2011 FINAL   Final     Studies/Results: Dg Pelvis Portable  07/31/2011  *RADIOLOGY REPORT*  Clinical Data: Status post left hip hemiarthroplasty  PORTABLE PELVIS  Comparison: 05/06/2011  Findings: Satisfactory appearance status post left hip arthroplasty.  Associated subcutaneous gas.  Overlying skin staples.  IMPRESSION: Satisfactory appearance status post left hip arthroplasty.  Original Report Authenticated By: Charline Bills, M.D.   Dg Hip Portable 1 View Left  07/31/2011  *RADIOLOGY  REPORT*  Clinical Data: Postop left hip hemiarthroplasty  PORTABLE LEFT HIP - 1 VIEW  Comparison: 05/06/2011  Findings: Status post left hip arthroplasty in near anatomic alignment and position.  Associated subcutaneous gas.  Overlying skin staples.  No fracture or dislocation is seen.  IMPRESSION: Status post left hip arthroplasty in near anatomic alignment and  position.  Original Report Authenticated By: Charline Bills, M.D.   2D echo Study Conclusions  Left ventricle: The cavity size was normal. Systolic function was normal. The estimated ejection fraction was in the range of 60% to 65%. Wall motion was normal; there were no regional wall motion abnormalities. Transthoracic echocardiography.    Medications:  Scheduled Meds:    . acetaminophen  1,000 mg Intravenous Q6H  .  ceFAZolin (ANCEF) IV  1 g Intravenous Q6H  . clonazePAM  0.5 mg Oral TID  . diazepam  5 mg Oral Once  . docusate sodium  100 mg Oral BID  . enoxaparin  30 mg Subcutaneous Q12H  . Fluticasone-Salmeterol  1 puff Inhalation Q12H  . nicotine  14 mg Transdermal Daily  . pantoprazole  40 mg Oral Q1200  . senna  1 tablet Oral BID  . sodium chloride  3 mL Intravenous Q12H  . tiotropium  18 mcg Inhalation Daily   Continuous Infusions:    . sodium chloride 75 mL/hr at 08/01/11 1814   PRN Meds:.acetaminophen, acetaminophen, albuterol, alum & mag hydroxide-simeth, HYDROmorphone (DILAUDID) injection, menthol-cetylpyridinium, methocarbamol (ROBAXIN) IV, methocarbamol, metoCLOPramide, ondansetron (ZOFRAN) IV, ondansetron, oxyCODONE, phenol   Assessment:   #1 right hip fracture, -2-D echo with EF 60-65% wall motion normal, pt chest pain free - cleared for surgery - Status post left total hip replacement, PT recommending home health with supervision when dc'ed. #2 asthma/COPD -Stable -continue on her nebulizer treatments  #3 hypertension  -BP stable this am, but still soft, will continue to monitor and hold off lisinopril #4 gastroesophageal reflux disease  -continue Protonix #5Tobacco abuse - continue nicotine patch #6 postop anemia -Hgb improved to 7.7 this am s/p transfusion of 2unitsPRBC. #7 thrombocytopenia - worsening now down to( 66 was 92 on admission) - discussed with Dr Madelon Lips will d/c lovenox, start coumadin for post hip prophylaxis -  Check  HIT  panel No gross bleeding. - follow and recheck - transfer to 5000 as per 6/8 ortho note  LOS: 4 days   Jaxon Mynhier C 08/02/2011, 7:39 AM

## 2011-08-02 NOTE — Progress Notes (Signed)
Subjective: 2 Days Post-Op Procedure(s) (LRB): TOTAL HIP ARTHROPLASTY (Left) Patient reports pain as mild.    Objective: Vital signs in last 24 hours: Temp:  [98.4 F (36.9 C)-98.9 F (37.2 C)] 98.9 F (37.2 C) (06/09 0433) Pulse Rate:  [96-112] 102  (06/09 0433) Resp:  [16-20] 18  (06/09 0800) BP: (101-118)/(63-75) 101/66 mmHg (06/09 0433) SpO2:  [94 %-98 %] 96 % (06/09 0433)  Intake/Output from previous day: 06/08 0701 - 06/09 0700 In: 3341.3 [I.V.:2616.3; Blood:725] Out: 3050 [Urine:3050] Intake/Output this shift: Total I/O In: 240 [P.O.:240] Out: -    Basename 08/02/11 0540 08/01/11 0605  HGB 9.7* 8.0*    Basename 08/02/11 0540 08/01/11 0605  WBC 6.9 6.6  RBC 3.10* 2.53*  HCT 28.6* 24.1*  PLT 66* 88*    Basename 08/02/11 0540 08/01/11 0605  NA 139 140  K 3.8 4.1  CL 105 104  CO2 26 26  BUN 14 11  CREATININE 0.64 0.72  GLUCOSE 105* 133*  CALCIUM 8.0* 8.3*   No results found for this basename: LABPT:2,INR:2 in the last 72 hours  Neurovascular intact Sensation intact distally Intact pulses distally Dorsiflexion/Plantar flexion intact Incision: dressing C/D/I and scant drainage No cellulitis present  Assessment/Plan: 2 Days Post-Op Procedure(s) (LRB): TOTAL HIP ARTHROPLASTY (Left) Up with therapy Plan for discharge tomorrow Transfer to 5000 today  Margart Sickles 08/02/2011, 10:43 AM

## 2011-08-02 NOTE — Progress Notes (Signed)
ANTICOAGULATION CONSULT NOTE - Initial Consult  Pharmacy Consult for coumadin Indication: VTE prophylaxis  No Known Allergies  Vital Signs: Temp: 98.9 F (37.2 C) (06/09 0433) Temp src: Oral (06/09 0433) BP: 101/66 mmHg (06/09 0433) Pulse Rate: 102  (06/09 0433)  Labs:  Alvira Philips 08/02/11 0540 08/01/11 0605  HGB 9.7* 8.0*  HCT 28.6* 24.1*  PLT 66* 88*  APTT -- --  LABPROT -- --  INR -- --  HEPARINUNFRC -- --  CREATININE 0.64 0.72  CKTOTAL -- --  CKMB -- --  TROPONINI -- --    The CrCl is unknown because both a height and weight (above a minimum accepted value) are required for this calculation.   Medical History: Past Medical History  Diagnosis Date  . Asthma   . Bronchitis   . Hypertension   . GERD (gastroesophageal reflux disease)   . Chronic back pain   . Sciatic nerve pain   . Shortness of breath   . Sleep apnea     stops breathing at night sometimes, uses o2 at 2L HS  . Arthritis   . Anxiety   . COPD (chronic obstructive pulmonary disease)   . Headache     Medications:  Prescriptions prior to admission  Medication Sig Dispense Refill  . albuterol (PROVENTIL HFA;VENTOLIN HFA) 108 (90 BASE) MCG/ACT inhaler Inhale 2 puffs into the lungs every 6 (six) hours as needed. shortness of breath       . albuterol (PROVENTIL) (2.5 MG/3ML) 0.083% nebulizer solution Take 2.5 mg by nebulization every 6 (six) hours as needed. wheezing/shortness of breath      . aspirin EC 81 MG tablet Take 81 mg by mouth daily.      . clonazePAM (KLONOPIN) 0.5 MG tablet Take 0.5 mg by mouth 3 (three) times daily.       . Fluticasone-Salmeterol (ADVAIR) 500-50 MCG/DOSE AEPB Inhale 1 puff into the lungs every 12 (twelve) hours.        Marland Kitchen HYDROcodone-acetaminophen (NORCO) 10-325 MG per tablet Take 1 tablet by mouth every 4 (four) hours as needed. For pain      . lisinopril (PRINIVIL,ZESTRIL) 20 MG tablet Take 20 mg by mouth daily.        Marland Kitchen omeprazole (PRILOSEC) 20 MG capsule Take 20 mg by  mouth daily.      . predniSONE (DELTASONE) 20 MG tablet Take 20 mg by mouth See admin instructions. Take 3 tabs daily x1 week, 2 tabs 3 times daily, 1 tab 3 times daily, 1/2 tab x3 days then 1/2 tab every other day until gone per patient      . tiotropium (SPIRIVA) 18 MCG inhalation capsule Place 18 mcg into inhaler and inhale daily.          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 Initially started on prophylactic lovenox post-op however, pts platelets have dropped. Lovenox has been dc'd and a HIT panel ordered. However, the patients platelets have been low since admission. Baseline INR is 1.1.  Goal of Therapy:  INR 2-3 Monitor platelets by anticoagulation protocol: Yes   Plan:  1. Coumadin 7.5mg  PO x 1 tonight 2. Daily INR 3. Coumadin book + video  [place]aled[place] Zou, Drake Leach 08/02/2011,9:40 AM

## 2011-08-02 NOTE — Progress Notes (Signed)
Physical Therapy Treatment Patient Details Name: [name] MRN: [id number] DOB: 1960/10/18 Today's Date: 08/02/2011 Time: 4098-1191 PT Time Calculation (min): 41 min  PT Assessment / Plan / Recommendation Comments on Treatment Session  Pt doing very well, set goal of ambulating to nurses station and was able to reach goal. Pt continues with slow steady gait.  Able to recall 3/3 hip precautions.    Follow Up Recommendations  Home health PT       Equipment Recommendations  3 in 1 bedside comode       Frequency 7X/week   Plan Discharge plan remains appropriate;Frequency remains appropriate    Precautions / Restrictions Precautions Precautions: Posterior Hip;Fall Precaution Booklet Issued: Yes (comment) Restrictions Weight Bearing Restrictions: Yes LLE Weight Bearing: Weight bearing as tolerated       Mobility  Bed Mobility Bed Mobility: Supine to Sit;Sitting - Scoot to Edge of Bed;Sit to Supine Supine to Sit: 4: Min assist;With rails;HOB flat Sitting - Scoot to Edge of Bed: 5: Supervision Sit to Supine: 4: Min assist;With rail;HOB flat Details for Bed Mobility Assistance: Cueing for technique and to maintain precautions. Assist for Lt. LE  Transfers Transfers: Sit to Stand;Stand to Sit Sit to Stand: With upper extremity assist;From bed;5: Supervision Stand to Sit: 4: Min guard;With upper extremity assist;To bed Details for Transfer Assistance: Cueing for hand and LLE placement Ambulation/Gait Ambulation/Gait Assistance: 4: Min guard Ambulation Distance (Feet): 180 Feet Assistive device: Rolling walker Ambulation/Gait Assistance Details: verbal cues for avoiding getting too close to RW. Cues for posture and monitoring fatigue. Gait Pattern: Step-to pattern Gait velocity: Very slow gait speed General Gait Details: Cueing for sequence during turns. Stairs: No         PT Goals Acute Rehab PT Goals PT Goal Formulation: With patient/family Time For Goal  Achievement: 08/08/11 Potential to Achieve Goals: Good Pt will go Supine/Side to Sit: with modified independence PT Goal: Supine/Side to Sit - Progress: Progressing toward goal Pt will go Sit to Supine/Side: with modified independence PT Goal: Sit to Supine/Side - Progress: Progressing toward goal Pt will go Sit to Stand: with modified independence PT Goal: Sit to Stand - Progress: Progressing toward goal Pt will go Stand to Sit: with modified independence PT Goal: Stand to Sit - Progress: Progressing toward goal Pt will Ambulate: >150 feet;with modified independence;with least restrictive assistive device PT Goal: Ambulate - Progress: Progressing toward goal Additional Goals Additional Goal #1: Pt will independently state and demonstrate adherence to 3/3 hip precautions PT Goal: Additional Goal #1 - Progress: Progressing toward goal  Visit Information  Last PT Received On: 08/02/11 Assistance Needed: +1    Subjective Data  Subjective: I'm feeling better   Cognition  Overall Cognitive Status: Appears within functional limits for tasks assessed/performed Arousal/Alertness: Awake/alert Orientation Level: Appears intact for tasks assessed Behavior During Session: [facility] for tasks performed       End of Session PT - End of Session Equipment Utilized During Treatment: Gait belt;Left knee immobilizer Activity Tolerance: Patient tolerated treatment well Patient left: in bed;with call bell/phone within reach Nurse Communication: Mobility status    Wilhemina Bonito 08/02/2011, 5:12 PM   Dahlia Client (Beverely Pace) Carleene Mains PT, DPT Acute Rehabilitation [phone]

## 2011-08-03 ENCOUNTER — Inpatient Hospital Stay (HOSPITAL_COMMUNITY): Payer: Medicaid Other

## 2011-08-03 ENCOUNTER — Encounter (HOSPITAL_COMMUNITY): Payer: Self-pay | Admitting: Orthopedic Surgery

## 2011-08-03 DIAGNOSIS — J45909 Unspecified asthma, uncomplicated: Secondary | ICD-10-CM

## 2011-08-03 DIAGNOSIS — M311 Thrombotic microangiopathy, unspecified: Secondary | ICD-10-CM

## 2011-08-03 DIAGNOSIS — Z96649 Presence of unspecified artificial hip joint: Secondary | ICD-10-CM

## 2011-08-03 DIAGNOSIS — S72009A Fracture of unspecified part of neck of unspecified femur, initial encounter for closed fracture: Secondary | ICD-10-CM

## 2011-08-03 LAB — BASIC METABOLIC PANEL
Calcium: 8 mg/dL — ABNORMAL LOW (ref 8.4–10.5)
Creatinine, Ser: 0.59 mg/dL (ref 0.50–1.10)
GFR calc Af Amer: >90 mL/min (ref >90)

## 2011-08-03 LAB — CBC
MCH: 30.7 pg (ref 26.0–34.0)
MCV: 93.5 fL (ref 78.0–100.0)
Platelets: 60 10*3/uL — ABNORMAL LOW (ref 150–400)
RDW: 18 % — ABNORMAL HIGH (ref 11.5–15.5)

## 2011-08-03 LAB — TISSUE CULTURE

## 2011-08-03 LAB — PROTIME-INR: Prothrombin Time: 15.9 seconds — ABNORMAL HIGH (ref 11.6–15.2)

## 2011-08-03 MED ORDER — WARFARIN SODIUM 5 MG PO TABS: 5.0000 mg | ORAL_TABLET | Freq: Every day | ORAL | Status: DC

## 2011-08-03 MED ORDER — WARFARIN SODIUM 7.5 MG PO TABS
7.5000 mg | ORAL_TABLET | Freq: Once | ORAL | Status: DC
  Filled 2011-08-03: qty 1

## 2011-08-03 MED ORDER — METHOCARBAMOL 500 MG PO TABS: 500.0000 mg | ORAL_TABLET | Freq: Two times a day (BID) | ORAL | Status: AC | PRN

## 2011-08-03 MED ORDER — HYDROCODONE-ACETAMINOPHEN 10-325 MG PO TABS: 1.0000 | ORAL_TABLET | ORAL | Status: AC | PRN

## 2011-08-03 NOTE — Anesthesia Postprocedure Evaluation (Signed)
  Anesthesia Post-op Note  Patient: [name]  Procedure(s) Performed: Procedure(s) (LRB): TOTAL HIP ARTHROPLASTY (Left)  Patient Location: PACU  Anesthesia Type: General  Level of Consciousness: awake, alert , oriented and patient cooperative  Airway and Oxygen Therapy: Patient Spontanous Breathing and Patient connected to nasal cannula oxygen  Post-op Pain: moderate  Post-op Assessment: Post-op Vital signs reviewed, Patient's Cardiovascular Status Stable, Respiratory Function Stable, Patent Airway, No signs of Nausea or vomiting and Pain level controlled  Post-op Vital Signs: stable  Complications: No apparent anesthesia complications

## 2011-08-03 NOTE — Progress Notes (Signed)
CARE MANAGEMENT NOTE 08/03/2011  Patient:  [name], [name]   Account Number:  [id number]  Date Initiated:  07/30/2011  Documentation initiated by:  [facility]  Subjective/Objective Assessment:   Admitted with hip fx  Has daughter and brother.  Lives in [place], [place].     Action/Plan:   Anticipated DC Date:  08/06/2011   Anticipated DC Plan:  HOME W HOME HEALTH SERVICES      DC Planning Services  CM consult      [facility] Choice  HOME HEALTH   Choice offered to / List presented to:  C-1 Patient        HH arranged  HH-2 PT  HH-1 RN      [facility] agency  [facility]   Status of service:  Completed, signed off

## 2011-08-03 NOTE — Progress Notes (Signed)
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   Assessment: 51 y.o. 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 (INR 1.24 << 1.1, goal of 2-3). Hgb/Hct/Plt slight drop again today. Plts 60 << 66. No s/sx of bleeding at this time. The patient was educated on warfarin today.  It is noted that the patient was originally on Lovenox however due to drops in platelets -- this was d/ced and a HIT panel was ordered. HIT panel still pending this a.m. The patient's platelets were low on admission at 92.   Goal of Therapy:  INR 2-3 Monitor platelets by anticoagulation protocol: Yes   Plan:  1. Warfarin 7.5 mg x 1 dose at 1800 today 2. Will continue to monitor for any signs/symptoms of bleeding and will follow up with PT/INR in the a.m.   Georgina Pillion, PharmD, BCPS Clinical Pharmacist Pager: [phone] 08/03/2011 9:08 AM

## 2011-08-03 NOTE — Progress Notes (Signed)
Subjective: 3 Days Post-Op Procedure(s) (LRB): TOTAL HIP ARTHROPLASTY (Left) Patient reports pain as mild.    Objective: Vital signs in last 24 hours: Temp:  [98.3 F (36.8 C)-101.3 F (38.5 C)] 99.2 F (37.3 C) (06/10 0459) Pulse Rate:  [98-109] 100  (06/10 0459) Resp:  [16-20] 16  (06/10 0459) BP: (100-121)/(61-72) 115/72 mmHg (06/10 0459) SpO2:  [94 %-100 %] 96 % (06/10 0900)  Intake/Output from previous day: 06/09 0701 - 06/10 0700 In: 480 [P.O.:480] Out: -  Intake/Output this shift: Total I/O In: -  Out: 200 [Urine:200]   Basename 08/03/11 0600 08/02/11 0540 08/01/11 0605  HGB 9.0* 9.7* 8.0*    Basename 08/03/11 0600 08/02/11 0540  WBC 4.5 6.9  RBC 2.93* 3.10*  HCT 27.4* 28.6*  PLT 60* 66*    Basename 08/03/11 0600 08/02/11 0540  NA 137 139  K 3.6 3.8  CL 103 105  CO2 27 26  BUN 11 14  CREATININE 0.59 0.64  GLUCOSE 94 105*  CALCIUM 8.0* 8.0*    Basename 08/03/11 0600  LABPT --  INR 1.24    Neurovascular intact Sensation intact distally Intact pulses distally Dorsiflexion/Plantar flexion intact Incision: dressing C/D/I  Assessment/Plan: 3 Days Post-Op Procedure(s) (LRB): TOTAL HIP ARTHROPLASTY (Left) Up with therapy Ok to d/c home with home health per ortho standpoint if medicine in agreement, pain rx in chart, will need home coumadin for dvt proph would appreciate medicine dosing recommendation for this.  Margart Sickles 08/03/2011, 9:18 AM

## 2011-08-03 NOTE — Progress Notes (Addendum)
Physical Therapy Treatment Note   08/03/11 0850  PT Visit Information  Last PT Received On 08/03/11  Assistance Needed +1  PT Time Calculation  PT Start Time 0850  PT Stop Time 0923  PT Time Calculation (min) 33 min  Subjective Data  Subjective Pt received sitting up in bed with + lethargy/difficulty maintaining eye opening. Patient with request to use restroom.  Precautions  Precautions Posterior Hip;Fall  Precaution Booklet Issued Yes (comment)  Precaution Comments pt able to recall 3/3 precautions however required freq. v/c's to adhere to them during transfers  Required Braces or Orthoses Knee Immobilizer - Left (when lying in bed or sleeping)  Restrictions  LLE Weight Bearing WBAT  Cognition  Overall Cognitive Status Appears within functional limits for tasks assessed/performed  Arousal/Alertness Lethargic  Orientation Level Appears intact for tasks assessed  Behavior During Session Lethargic  Transfers  Transfers Sit to Stand;Stand to Sit  Sit to Stand 5: Supervision;From chair/3-in-1;With armrests  Stand to Sit 5: Supervision;To chair/3-in-1;Without upper extremity assist  Details for Transfer Assistance vc for hip precautions during transfer  Ambulation/Gait  Ambulation/Gait Assistance 5: Supervision  Ambulation Distance (Feet) 100 Feet  Assistive device Rolling walker  Ambulation/Gait Assistance Details verbal cues for sequencing and to perform L knee flexion druing swing phase. freq v/c's to adhere to hip precautions when turning with RW.  Gait Pattern Step-through pattern;Decreased step length - left;Decreased stance time - left  Gait velocity Very slow gait speed  Stairs Yes  Stairs Assistance 4: Min guard  Stair Management Technique No rails;Forwards;With walker (platform step to mimic home set up)  Number of Stairs 1   Wheelchair Mobility  Wheelchair Mobility No  Balance  Balance Assessed No  PT - End of Session  Equipment Utilized During Treatment Gait  belt  Activity Tolerance Patient tolerated treatment well  Patient left in chair;with call bell/phone within reach  Nurse Communication Mobility status  PT - Assessment/Plan  Comments on Treatment Session Pt progressing towards all goals however remains to have difficulty adhereing to post hip prec during transfers and turning with RW. Patient safe to return home this date with recommended DME, 24/7 assist/supervision, and HHPT.  PT Plan Discharge plan remains appropriate;Frequency remains appropriate  PT Frequency 7X/week  Follow Up Recommendations Home health PT  Acute Rehab PT Goals  Time For Goal Achievement 08/08/11  Potential to Achieve Goals Good  PT Goal: Sit to Stand - Progress Progressing toward goal  PT Goal: Stand to Sit - Progress Progressing toward goal  PT Goal: Ambulate - Progress Progressing toward goal  Additional Goals  PT Goal: Additional Goal #1 - Progress Progressing toward goal    Charges:  Gait training x 1, Therapeutic Activity x 1   There act: assist pt to commode. Pt supervision with transfer on/off commode. Pt required v/c's to adhere to post hip prec during hygiene.  Pain: pt did not rate L hip pain.  Lewis Shock, PT, DPT Pager #: [phone] Office #: [phone]

## 2011-08-03 NOTE — Discharge Summary (Signed)
Physician Discharge Summary  [name] MRN: [id number] DOB/AGE: [birth year] 51 y.o.  PCP: Ernestine Conrad, MD, MD   Admit date: 07/29/2011 Discharge date: 08/03/2011  Discharge Diagnoses:    Hip fracture    ANEMIA-NOS  ANXIETY  TOBACCO USE  DEPRESSION   Medication List  As of 08/03/2011  1:20 PM   TAKE these medications         albuterol 108 (90 BASE) MCG/ACT inhaler   Commonly known as: PROVENTIL HFA;VENTOLIN HFA   Inhale 2 puffs into the lungs every 6 (six) hours as needed. shortness of breath      albuterol (2.5 MG/3ML) 0.083% nebulizer solution   Commonly known as: PROVENTIL   Take 2.5 mg by nebulization every 6 (six) hours as needed. wheezing/shortness of breath      aspirin EC 81 MG tablet   Take 81 mg by mouth daily.      clonazePAM 0.5 MG tablet   Commonly known as: KLONOPIN   Take 0.5 mg by mouth 3 (three) times daily.      Fluticasone-Salmeterol 500-50 MCG/DOSE Aepb   Commonly known as: ADVAIR   Inhale 1 puff into the lungs every 12 (twelve) hours.      HYDROcodone-acetaminophen 10-325 MG per tablet   Commonly known as: NORCO   Take 1 tablet by mouth every 4 (four) hours as needed for pain.      HYDROcodone-acetaminophen 10-325 MG per tablet   Commonly known as: NORCO   Take 1 tablet by mouth every 4 (four) hours as needed. For pain      lisinopril 20 MG tablet   Commonly known as: PRINIVIL,ZESTRIL   Take 20 mg by mouth daily.      methocarbamol 500 MG tablet   Commonly known as: ROBAXIN   Take 1 tablet (500 mg total) by mouth 2 (two) times daily as needed.      omeprazole 20 MG capsule   Commonly known as: PRILOSEC   Take 20 mg by mouth daily.      predniSONE 20 MG tablet   Commonly known as: DELTASONE   Take 20 mg by mouth See admin instructions. Take 3 tabs daily x1 week, 2 tabs 3 times daily, 1 tab 3 times daily, 1/2 tab x3 days then 1/2 tab every other day until gone per patient      tiotropium 18 MCG inhalation capsule   Commonly  known as: SPIRIVA   Place 18 mcg into inhaler and inhale daily.      warfarin 5 MG tablet   Commonly known as: COUMADIN   Take 1 tablet (5 mg total) by mouth daily.            Discharge Condition: sTable Disposition: 01-Home or Self Care   Consults: Orthopedics   Significant Diagnostic Studies: Dg Chest 1 View  08/03/2011  *RADIOLOGY REPORT*  Clinical Data: Postop fever.  CHEST - 1 VIEW  Comparison: 07/30/2011.  Findings: No segmental infiltrate.  Minimal peribronchial thickening. Minimal left base subsegmental atelectasis.  Heart size within normal limits.  Minimally tortuous aorta.  IMPRESSION: No segmental infiltrate.  Minimal left base subsegmental atelectasis.  Original Report Authenticated By: Fuller Canada, M.D.   Mr Lumbar Spine W Wo Contrast  07/15/2011  *RADIOLOGY REPORT*  Clinical Data: Low back pain with difficulty walking.  Recent spinal injection.  Rule out epidural abscess.  MRI LUMBAR SPINE WITHOUT AND WITH CONTRAST  Technique:  Multiplanar and multiecho pulse sequences of the lumbar spine were  obtained without and with intravenous contrast.  Contrast: 15mL MULTIHANCE GADOBENATE DIMEGLUMINE 529 MG/ML IV SOLN  Comparison: Lumbar MRI 07/01/2004  Findings: No evidence of infection or epidural abscess.  Negative for diskitis.  No bone marrow edema or abnormal enhancement is identified.  Negative for fracture or mass.  Conus medullaris is normal and terminates at L1-2.  T12-L1:  Small left-sided disc and osteophyte.  L1-2:  Mild disc degeneration and disc bulging without stenosis.  L2-3:  Disc degeneration with disc bulging.  Mild facet degeneration.  L3-4:  Mild disc bulging and mild vertebral spurring without significant stenosis.  L4-5:  Mild disc bulging and spondylosis.  Bilateral facet hypertrophy.  Mild foraminal stenosis bilaterally.  L5-S1:  Mild disc bulging and mild facet degeneration.  Foraminal encroachment is present bilaterally.  IMPRESSION: No evidence of spinal  infection or epidural abscess.  Negative for fracture.  Lumbar degenerative changes as above.  There is foraminal narrowing bilaterally at L4-5 and L5-S1 due to disc bulging and facet hypertrophy.  No significant central canal stenosis.  Original Report Authenticated By: Camelia Phenes, M.D.   Dg Pelvis Portable  07/31/2011  *RADIOLOGY REPORT*  Clinical Data: Status post left hip hemiarthroplasty  PORTABLE PELVIS  Comparison: 05/06/2011  Findings: Satisfactory appearance status post left hip arthroplasty.  Associated subcutaneous gas.  Overlying skin staples.  IMPRESSION: Satisfactory appearance status post left hip arthroplasty.  Original Report Authenticated By: Charline Bills, M.D.   Dg Chest Portable 1 View  07/30/2011  *RADIOLOGY REPORT*  Clinical Data: Preoperative respiratory exam for left hip fracture.  CHEST - 1 VIEW  Comparison:  07/29/2011  Findings: The heart size and mediastinal contours are within normal limits.  Both lungs are clear.  IMPRESSION: No active disease.  Original Report Authenticated By: Reola Calkins, M.D.   Dg Chest Port 1 View  07/29/2011  *RADIOLOGY REPORT*  Clinical Data: Preop  PORTABLE CHEST - 1 VIEW  Comparison: CT chest of 01/09/2009 and portable chest x-ray of 01/09/2009  Findings: The lungs are clear.  Mediastinal contours appear normal. The heart is within normal limits in size.  No bony abnormality is seen.  IMPRESSION: No active lung disease.  Original Report Authenticated By: Juline Patch, M.D.   Dg Hip Portable 1 View Left  07/31/2011  *RADIOLOGY REPORT*  Clinical Data: Postop left hip hemiarthroplasty  PORTABLE LEFT HIP - 1 VIEW  Comparison: 05/06/2011  Findings: Status post left hip arthroplasty in near anatomic alignment and position.  Associated subcutaneous gas.  Overlying skin staples.  No fracture or dislocation is seen.  IMPRESSION: Status post left hip arthroplasty in near anatomic alignment and position.  Original Report Authenticated By: Charline Bills, M.D.   2-D echo Left ventricle: The cavity size was normal. Systolic function was normal. The estimated ejection fraction was in the range of 60% to 65%. Wall motion was normal; there were no regional wall motion abnormalities.    Microbiology: Recent Results (from the past 240 hour(s))  SURGICAL PCR SCREEN     Status: Normal   Collection Time   07/30/11  5:07 AM      Component Value Range Status Comment   MRSA, PCR NEGATIVE  NEGATIVE  Final    Staphylococcus aureus NEGATIVE  NEGATIVE  Final   BODY FLUID CULTURE     Status: Normal (Preliminary result)   Collection Time   07/31/11  2:34 PM      Component Value Range Status Comment   Specimen Description FLUID SYNOVIAL  HIP LEFT   Final    Special Requests ON SWAB PT ON ANCEF   Final    Gram Stain     Final    Value: RARE WBC PRESENT, PREDOMINANTLY MONONUCLEAR     NO ORGANISMS SEEN     Gram Stain Report Called to,Read Back By and Verified With: Gram Stain Report Called to,Read Back By and Verified With: OR 07/31/11 1515 BY K SCHULTZ Performed at [facility]   Culture NO GROWTH 2 DAYS   Final    Report Status PENDING   Incomplete   ANAEROBIC CULTURE     Status: Normal (Preliminary result)   Collection Time   07/31/11  2:34 PM      Component Value Range Status Comment   Specimen Description FLUID SYNOVIAL HIP LEFT   Final    Special Requests ON SWAB PT ON ANCEF   Final    Gram Stain     Final    Value: RARE WBC PRESENT, PREDOMINANTLY MONONUCLEAR     NO ORGANISMS SEEN     Gram Stain Report Called to,Read Back By and Verified With: Gram Stain Report Called to,Read Back By and Verified With: OR 07/31/11 1515 BY K SCHULTZ Performed at [facility]   Culture     Final    Value: NO ANAEROBES ISOLATED; CULTURE IN PROGRESS FOR 5 DAYS   Report Status PENDING   Incomplete   GRAM STAIN     Status: Normal   Collection Time   07/31/11  2:34 PM      Component Value Range Status Comment   Specimen Description FLUID SYNOVIAL  HIP LEFT   Final    Special Requests ON SWAB PT ON ANCEF   Final    Gram Stain     Final    Value: RARE WBC PRESENT, PREDOMINANTLY MONONUCLEAR     NO ORGANISMS SEEN     CALLED TO OR 07/31/11 1515 BY K SCHULTZ   Report Status 07/31/2011 FINAL   Final   TISSUE CULTURE     Status: Normal   Collection Time   07/31/11  2:52 PM      Component Value Range Status Comment   Specimen Description TISSUE HIP LEFT   Final    Special Requests NONE   Final    Gram Stain     Final    Value: FEW WBC PRESENT,BOTH PMN AND MONONUCLEAR     NO ORGANISMS SEEN     Performed at [facility]   Culture NO GROWTH 3 DAYS   Final    Report Status 08/03/2011 FINAL   Final   ANAEROBIC CULTURE     Status: Normal (Preliminary result)   Collection Time   07/31/11  2:52 PM      Component Value Range Status Comment   Specimen Description TISSUE HIP LEFT   Final    Special Requests NONE   Final    Gram Stain     Final    Value: FEW WBC PRESENT,BOTH PMN AND MONONUCLEAR     NO ORGANISMS SEEN     Performed at [facility]   Culture     Final    Value: NO ANAEROBES ISOLATED; CULTURE IN PROGRESS FOR 5 DAYS   Report Status PENDING   Incomplete   GRAM STAIN     Status: Normal   Collection Time   07/31/11  2:52 PM      Component Value Range Status Comment   Specimen  Description TISSUE HIP LEFT   Final    Special Requests NONE   Final    Gram Stain     Final    Value: FEW WBC PRESENT,BOTH PMN AND MONONUCLEAR     NO ORGANISMS SEEN   Report Status 07/31/2011 FINAL   Final      Labs: Results for orders placed during the hospital encounter of 07/29/11 (from the past 48 hour(s))  CBC     Status: Abnormal   Collection Time   08/02/11  5:40 AM      Component Value Range Comment   WBC 6.9  4.0 - 10.5 (K/uL)    RBC 3.10 (*) 3.87 - 5.11 (MIL/uL)    Hemoglobin 9.7 (*) 12.0 - 15.0 (g/dL)    HCT 16.1 (*) 09.6 - 46.0 (%)    MCV 92.3  78.0 - 100.0 (fL)    MCH 31.3  26.0 - 34.0 (pg)    MCHC 33.9  30.0 - 36.0 (g/dL)     RDW 04.5 (*) 40.9 - 15.5 (%)    Platelets 66 (*) 150 - 400 (K/uL)   BASIC METABOLIC PANEL     Status: Abnormal   Collection Time   08/02/11  5:40 AM      Component Value Range Comment   Sodium 139  135 - 145 (mEq/L)    Potassium 3.8  3.5 - 5.1 (mEq/L)    Chloride 105  96 - 112 (mEq/L)    CO2 26  Ellen - 32 (mEq/L)    Glucose, Bld 105 (*) 70 - 99 (mg/dL)    BUN 14  6 - 23 (mg/dL)    Creatinine, Ser 8.11  0.50 - 1.10 (mg/dL)    Calcium 8.0 (*) 8.4 - 10.5 (mg/dL)    GFR calc non Af Amer >90  >90 (mL/min)    GFR calc Af Amer >90  >90 (mL/min)   CBC     Status: Abnormal   Collection Time   08/03/11  6:00 AM      Component Value Range Comment   WBC 4.5  4.0 - 10.5 (K/uL)    RBC 2.93 (*) 3.87 - 5.11 (MIL/uL)    Hemoglobin 9.0 (*) 12.0 - 15.0 (g/dL)    HCT 91.4 (*) 78.2 - 46.0 (%)    MCV 93.5  78.0 - 100.0 (fL)    MCH 30.7  26.0 - 34.0 (pg)    MCHC 32.8  30.0 - 36.0 (g/dL)    RDW 95.6 (*) 21.3 - 15.5 (%)    Platelets 60 (*) 150 - 400 (K/uL) CONSISTENT WITH PREVIOUS RESULT  BASIC METABOLIC PANEL     Status: Abnormal   Collection Time   08/03/11  6:00 AM      Component Value Range Comment   Sodium 137  135 - 145 (mEq/L)    Potassium 3.6  3.5 - 5.1 (mEq/L)    Chloride 103  96 - 112 (mEq/L)    CO2 27  Ellen - 32 (mEq/L)    Glucose, Bld 94  70 - 99 (mg/dL)    BUN 11  6 - 23 (mg/dL)    Creatinine, Ser 0.86  0.50 - 1.10 (mg/dL)    Calcium 8.0 (*) 8.4 - 10.5 (mg/dL)    GFR calc non Af Amer >90  >90 (mL/min)    GFR calc Af Amer >90  >90 (mL/min)   PROTIME-INR     Status: Abnormal   Collection Time   08/03/11  6:00 AM  Component Value Range Comment   Prothrombin Time 15.9 (*) 11.6 - 15.2 (seconds)    INR 1.24  0.00 - 1.49       HPI :51 year old Banks admitted as a direct admit from the office approximately a week ago with a 4-6-week history of hip pain. She  initially had falls. She had seen a pain management specialist who  potentially diagnosed radicular-type complaints, though her  MRI was  minimally abnormal. She had actually had a second MRI, which showed  slight localization of the left side, but it actually had epidurals.  She complained of left lower extremity pain and frequent falls. She  presented to [facility]  office, barely able to walk with an antalgic gait and  films revealed a displaced femoral neck fracture. She had a complicated  medical history. She was admitted for a total hip replacement on the left side by the hospitalist   HOSPITAL COURSE: #1Left total hip replacement postoperative day #3 OK to be discharged to home with home health PT OT by orthopedics Please see physical therapy recommendations per PT note 2-D echo done for preoperative clearance that shows EF of 60-65% next    #2 hypertension remained stable and is utilizing no   #3 DVT prophylaxis patient was started on Lovenox and doctor platelet count down to 66,000 from 92,000 and upon admission. She has been switched to Coumadin she will continue with Coumadin for one month Daily INR monitoring until the INR is therapeutic. Goal INR between 2 and 3. She will remain on Coumadin for about a month  #4 postoperative anemia hemoglobin dropped from 11-9.0 the need a repeat CBC in 2 weeks   Discharge Exam:  Blood pressure 115/72, pulse 100, temperature 99.2 F (37.3 C), temperature source Oral, resp. rate 16, SpO2 96.00%.  General: Alert & oriented x3, in no distress, in good spirits  Cardiovascular: Normal rate, regular rhythm and intact distal pulses. Exam reveals no gallop and no friction rub.  No murmur heard.  Pulmonary/Chest: Effort normal and breath sounds normal. She has no wheezes. no crackles.  Abdominal: Soft. no distension,NT, no guarding.  Extremities: No cyanosis, no edema, LLE in immobilizer Skin: Skin is warm and dry.  Psychiatric: She has a normal mood and affect      Follow-up Information    Call CAFFREY JR,W D, MD. (to make appt on 08/13/11)    Contact  information:   Tracey Harries, [facility] [address] [place] [address] [phone]          Signed: Richarda Overlie 08/03/2011, 1:20 PM

## 2011-08-03 NOTE — Discharge Instructions (Signed)
Diet: As you were doing prior to hospitalization   Activity:  Increase activity slowly as tolerated                  No lifting or driving for 6 weeks  Shower:  May shower without a dressing starting Wed 08/05/11, NO tub SOAKING  Dressing:  You may change your dressing daily following shower, clean dry dressing  Weight Bearing:   weight bearing as taught in physical therapy.  Use a walker or                    Crutches as instructed.  To prevent constipation: you may use a stool softener such as -               Colace ( over the counter) 100 mg by mouth twice a day                Drink plenty of fluids ( prune juice may be helpful) and high fiber foods                Miralax ( over the counter) for constipation as needed.    Precautions:  If you experience chest pain or shortness of breath - call 911 immediately               For transfer to the hospital emergency department!!               If you develop a fever greater that 101 F, purulent drainage from wound,                             increased redness or drainage from wound, or calf pain -- Call the office at                                                 [phone].  Follow- Up Appointment:  Please call for an appointment to be seen on 08/13/11               [facility] - [phone]               Jonita Albee - [phone]               [place] - [phone]    Home Health to be provided by [facility]-[phone]  No driving for one month  Home health RN for daily INR monitoring until INR between 2-3 And then every 3 days  INR Results to PCP

## 2011-08-04 LAB — BODY FLUID CULTURE: Culture: NO GROWTH

## 2011-08-04 NOTE — Progress Notes (Signed)
Utilization review completed. Brynli Ollis, RN, BSN. 08/04/11  

## 2011-08-05 LAB — HEPARIN INDUCED THROMBOCYTOPENIA PNL
Heparin Induced Plt Ab: NEGATIVE
UFH Low Dose 0.1 IU/mL: 0 % Release
UFH SRA Result: NEGATIVE

## 2011-08-05 LAB — ANAEROBIC CULTURE

## 2012-06-24 ENCOUNTER — Encounter (HOSPITAL_COMMUNITY): Payer: Self-pay | Admitting: Pharmacy Technician

## 2012-06-24 ENCOUNTER — Other Ambulatory Visit: Payer: Self-pay | Admitting: Physician Assistant

## 2012-06-24 DIAGNOSIS — M87051 Idiopathic aseptic necrosis of right femur: Secondary | ICD-10-CM

## 2012-06-27 ENCOUNTER — Encounter (HOSPITAL_COMMUNITY): Payer: Self-pay

## 2012-06-27 ENCOUNTER — Encounter (HOSPITAL_COMMUNITY)
Admission: RE | Admit: 2012-06-27 | Discharge: 2012-06-27 | Disposition: A | Discharge status: Home / Self Care | Payer: Medicaid Other | Source: Ambulatory Visit | Attending: Orthopedic Surgery | Admitting: Orthopedic Surgery

## 2012-06-27 ENCOUNTER — Encounter (HOSPITAL_COMMUNITY)
Admission: RE | Admit: 2012-06-27 | Discharge: 2012-06-27 | Disposition: A | Discharge status: Home / Self Care | Payer: Medicaid Other | Source: Ambulatory Visit | Attending: Physician Assistant | Admitting: Physician Assistant

## 2012-06-27 HISTORY — DX: Unspecified convulsions: R56.9

## 2012-06-27 LAB — COMPREHENSIVE METABOLIC PANEL
AST: 63 U/L — ABNORMAL HIGH (ref 0–37)
BUN: 9 mg/dL (ref 6–23)
CO2: 28 mEq/L (ref 19–32)
Calcium: 9.6 mg/dL (ref 8.4–10.5)
Chloride: 103 mEq/L (ref 96–112)
Creatinine, Ser: 0.72 mg/dL (ref 0.50–1.10)
GFR calc Af Amer: >90 mL/min (ref >90)
GFR calc non Af Amer: >90 mL/min (ref >90)
Glucose, Bld: 67 mg/dL — ABNORMAL LOW (ref 70–99)
Total Bilirubin: 0.6 mg/dL (ref 0.3–1.2)

## 2012-06-27 LAB — CBC WITH DIFFERENTIAL/PLATELET
Eosinophils Relative: 4 % (ref 0–5)
HCT: 39.1 % (ref 36.0–46.0)
Hemoglobin: 13.7 g/dL (ref 12.0–15.0)
Lymphocytes Relative: 43 % (ref 12–46)
Lymphs Abs: 1.8 10*3/uL (ref 0.7–4.0)
MCV: 86.5 fL (ref 78.0–100.0)
Monocytes Absolute: 0.5 10*3/uL (ref 0.1–1.0)
Monocytes Relative: 11 % (ref 3–12)
Neutro Abs: 1.8 10*3/uL (ref 1.7–7.7)
RDW: 15.6 % — ABNORMAL HIGH (ref 11.5–15.5)
WBC: 4.2 10*3/uL (ref 4.0–10.5)

## 2012-06-27 LAB — SURGICAL PCR SCREEN
MRSA, PCR: NEGATIVE
Staphylococcus aureus: NEGATIVE

## 2012-06-27 LAB — PROTIME-INR
INR: 1.13 (ref 0.00–1.49)
Prothrombin Time: 14.3 seconds (ref 11.6–15.2)

## 2012-06-27 LAB — URINALYSIS, ROUTINE W REFLEX MICROSCOPIC
Hgb urine dipstick: NEGATIVE
Nitrite: NEGATIVE
Protein, ur: NEGATIVE mg/dL
Urobilinogen, UA: 2 mg/dL — ABNORMAL HIGH (ref 0.0–1.0)

## 2012-06-27 LAB — URINE MICROSCOPIC-ADD ON

## 2012-06-27 LAB — APTT: aPTT: 32 seconds (ref 24–37)

## 2012-06-27 NOTE — Progress Notes (Signed)
LEFT MESSAGE REQUESTING IF PATIENT HAD A CLEARANCE NOTE , THAT IT BE FAXED TO PAT [id number].

## 2012-06-27 NOTE — Progress Notes (Signed)
06/27/12 1216  OBSTRUCTIVE SLEEP APNEA  Have you ever been diagnosed with sleep apnea through a sleep study? No  Do you snore loudly (loud enough to be heard through closed doors)?  0  Do you often feel tired, fatigued, or sleepy during the daytime? 1  Has anyone observed you stop breathing during your sleep? 1  Do you have, or are you being treated for high blood pressure? 1  BMI more than 35 kg/m2? 0  Age over 54 years old? 1  Neck circumference greater than 40 cm/18 inches? 0  Gender: 0  Obstructive Sleep Apnea Score 4  Score 4 or greater  Results sent to PCP

## 2012-06-27 NOTE — Pre-Procedure Instructions (Signed)
[name]  06/27/2012   Your procedure is scheduled on:  Friday  07/01/12   Report to [facility] at 530 AM.  Call this number if you have problems the morning of surgery: [phone]   Remember:   Do not eat food or drink liquids after midnight.   Take these medicines the morning of surgery with A SIP OF WATER: ALBUTEROL INHALER/NEBULIZER, CLONAZEPAM, ADVAIR, HYDROCODONE(NORCO) IF NEEDED, OMEPRAZOLE, SPIRIVA (STOP ASPIRIN)     Do not wear jewelry, make-up or nail polish.  Do not wear lotions, powders, or perfumes. You may wear deodorant.  Do not shave 48 hours prior to surgery. Men may shave face and neck.  Do not bring valuables to the hospital.  Contacts, dentures or bridgework may not be worn into surgery.  Leave suitcase in the car. After surgery it may be brought to your room.  For patients admitted to the hospital, checkout time is 11:00 AM the day of  discharge.   Patients discharged the day of surgery will not be allowed to drive  home.  Name and phone number of your driver:   Special Instructions: Shower using CHG 2 nights before surgery and the night before surgery.  If you shower the day of surgery use CHG.  Use special wash - you have one bottle of CHG for all showers.  You should use approximately 1/3 of the bottle for each shower.   Please read over the following fact sheets that you were given: Pain Booklet, Coughing and Deep Breathing, MRSA Information and Surgical Site Infection Prevention

## 2012-06-28 NOTE — Progress Notes (Addendum)
Anesthesia chart review: Patient is a 52 year old female scheduled for right THA by Dr. Madelon Lips on 07/01/2012. She is status post left THA on 07/31/2011. Other history includes smoking, hypertension, asthma, COPD and obstructive sleep apnea with use of 2L/George at night, chronic back pain, GERD, headaches, arthritis, single seizure in 2000, hysterectomy, cholecystectomy. PCP is Dr. Loney Hering who cleared patient for this procedure.     Echo on 07/30/11 showed: Left ventricle: The cavity size was normal. Systolic function was normal. The estimated ejection fraction was in the range of 60% to 65%. Wall motion was normal; there were no regional wall motion abnormalities. No significant valvular abnormalities.  EKG on 07/29/11 showed NSR with sinus arrhythmia.   CXR on 06/27/13 showed no acute disease.  Preoperative labs noted.  K+ 3.0, Cr 0.72, AST elevated at 63, ALT normal at 35.  H/H 13.7/39.1, PLT 77K.  PT/PTT WNL.  Urine culture is still pending. She had mild thrombocytopenia prior to her THA in June 2013 (92-147K), but it worsened post-operatively (60K).  Notes indicate that post-operative Lovenox was felt to be a contributing factor--however she is no longer on anti-coagulation therapy with Lovenox/Coumadin.  She is on a baby ASA.  I've requested additional records from her PCP, but these are still pending.  I also spoke with Sharia Reeve, Dr. Candise Bowens physician assistant.  Dr. Madelon Lips will plan to have PLT available if needed and will take her thrombocytopenia into consideration when determining post-operative anti-coagulation management.  I updated anesthesiologist Dr. Katrinka Blazing of thrombocytopenia history--no additional recommendations.  Velna Ochs [facility]Anesthesiology Phone [phone] 06/28/2012 5:15 PM  Addendum: 06/29/12 1050 I left a voicemail with Tresa Endo at Dr. Candise Bowens office re: urine culture results showing > 100,000 colonies of Proteus Mirabilis.

## 2012-06-29 LAB — URINE CULTURE

## 2012-06-30 ENCOUNTER — Other Ambulatory Visit: Payer: Self-pay | Admitting: Physician Assistant

## 2012-06-30 MED ORDER — CEFAZOLIN SODIUM-DEXTROSE 2-3 GM-% IV SOLR
2.0000 g | INTRAVENOUS | Status: AC
  Administered 2012-07-01: 2 g via INTRAVENOUS
  Filled 2012-06-30: qty 50

## 2012-06-30 MED ORDER — CHLORHEXIDINE GLUCONATE 4 % EX LIQD: 60.0000 mL | Freq: Once | CUTANEOUS | Status: DC

## 2012-06-30 MED ORDER — SODIUM CHLORIDE 0.9 % IV SOLN: INTRAVENOUS | Status: DC

## 2012-06-30 MED ORDER — ACETAMINOPHEN 10 MG/ML IV SOLN
1000.0000 mg | Freq: Once | INTRAVENOUS | Status: DC
  Filled 2012-06-30: qty 100

## 2012-07-01 ENCOUNTER — Encounter (HOSPITAL_COMMUNITY): Payer: Self-pay | Admitting: *Deleted

## 2012-07-01 ENCOUNTER — Encounter (HOSPITAL_COMMUNITY): Payer: Self-pay | Admitting: Vascular Surgery

## 2012-07-01 ENCOUNTER — Inpatient Hospital Stay (HOSPITAL_COMMUNITY): Payer: Medicaid Other

## 2012-07-01 ENCOUNTER — Encounter (HOSPITAL_COMMUNITY)
Admission: RE | Disposition: A | Discharge status: Home Health | Payer: Self-pay | Source: Ambulatory Visit | Attending: Orthopedic Surgery

## 2012-07-01 ENCOUNTER — Inpatient Hospital Stay (HOSPITAL_COMMUNITY)
Admission: RE | Admit: 2012-07-01 | Discharge: 2012-07-04 | DRG: 470 | Disposition: A | Discharge status: Home Health | Payer: Medicaid Other | Source: Ambulatory Visit | Attending: Orthopedic Surgery | Admitting: Orthopedic Surgery

## 2012-07-01 ENCOUNTER — Inpatient Hospital Stay (HOSPITAL_COMMUNITY): Payer: Medicaid Other | Admitting: Anesthesiology

## 2012-07-01 DIAGNOSIS — M169 Osteoarthritis of hip, unspecified: Principal | ICD-10-CM | POA: Diagnosis present

## 2012-07-01 DIAGNOSIS — J4489 Other specified chronic obstructive pulmonary disease: Secondary | ICD-10-CM | POA: Diagnosis present

## 2012-07-01 DIAGNOSIS — F411 Generalized anxiety disorder: Secondary | ICD-10-CM | POA: Diagnosis present

## 2012-07-01 DIAGNOSIS — J449 Chronic obstructive pulmonary disease, unspecified: Secondary | ICD-10-CM | POA: Diagnosis present

## 2012-07-01 DIAGNOSIS — M87059 Idiopathic aseptic necrosis of unspecified femur: Secondary | ICD-10-CM | POA: Insufficient documentation

## 2012-07-01 DIAGNOSIS — G40909 Epilepsy, unspecified, not intractable, without status epilepticus: Secondary | ICD-10-CM | POA: Diagnosis present

## 2012-07-01 DIAGNOSIS — I10 Essential (primary) hypertension: Secondary | ICD-10-CM | POA: Diagnosis present

## 2012-07-01 DIAGNOSIS — D696 Thrombocytopenia, unspecified: Secondary | ICD-10-CM | POA: Diagnosis present

## 2012-07-01 DIAGNOSIS — K219 Gastro-esophageal reflux disease without esophagitis: Secondary | ICD-10-CM | POA: Diagnosis present

## 2012-07-01 DIAGNOSIS — F17200 Nicotine dependence, unspecified, uncomplicated: Secondary | ICD-10-CM | POA: Diagnosis present

## 2012-07-01 DIAGNOSIS — N390 Urinary tract infection, site not specified: Secondary | ICD-10-CM | POA: Diagnosis present

## 2012-07-01 DIAGNOSIS — M1610 Unilateral primary osteoarthritis, unspecified hip: Principal | ICD-10-CM | POA: Diagnosis present

## 2012-07-01 HISTORY — PX: TOTAL HIP ARTHROPLASTY: SHX124

## 2012-07-01 SURGERY — ARTHROPLASTY, HIP, TOTAL,POSTERIOR APPROACH
Anesthesia: General | Site: Hip | Laterality: Right | Wound class: Clean

## 2012-07-01 MED ORDER — NEOSTIGMINE METHYLSULFATE 1 MG/ML IJ SOLN
INTRAMUSCULAR | Status: DC | PRN
  Administered 2012-07-01: 3 mg via INTRAVENOUS

## 2012-07-01 MED ORDER — METOCLOPRAMIDE HCL 10 MG PO TABS: 5.0000 mg | ORAL_TABLET | Freq: Three times a day (TID) | ORAL | Status: DC | PRN

## 2012-07-01 MED ORDER — FLEET ENEMA 7-19 GM/118ML RE ENEM: 1.0000 | ENEMA | Freq: Once | RECTAL | Status: AC | PRN

## 2012-07-01 MED ORDER — HYDROMORPHONE HCL PF 1 MG/ML IJ SOLN
INTRAMUSCULAR | Status: AC
  Filled 2012-07-01: qty 1

## 2012-07-01 MED ORDER — HYDROCODONE-ACETAMINOPHEN 10-325 MG PO TABS: 1.0000 | ORAL_TABLET | ORAL | Status: AC | PRN

## 2012-07-01 MED ORDER — DOCUSATE SODIUM 100 MG PO CAPS
100.0000 mg | ORAL_CAPSULE | Freq: Two times a day (BID) | ORAL | Status: DC
  Administered 2012-07-01 – 2012-07-04 (×7): 100 mg via ORAL
  Filled 2012-07-01 (×9): qty 1

## 2012-07-01 MED ORDER — BISACODYL 10 MG RE SUPP: 10.0000 mg | Freq: Every day | RECTAL | Status: DC | PRN

## 2012-07-01 MED ORDER — WARFARIN SODIUM 7.5 MG PO TABS
7.5000 mg | ORAL_TABLET | Freq: Once | ORAL | Status: AC
  Administered 2012-07-01: 7.5 mg via ORAL
  Filled 2012-07-01: qty 1

## 2012-07-01 MED ORDER — HYDROMORPHONE HCL PF 1 MG/ML IJ SOLN
0.2500 mg | INTRAMUSCULAR | Status: DC | PRN
  Administered 2012-07-01 (×4): 0.5 mg via INTRAVENOUS

## 2012-07-01 MED ORDER — BUPIVACAINE HCL (PF) 0.25 % IJ SOLN
INTRAMUSCULAR | Status: DC | PRN
  Administered 2012-07-01: 20 mL

## 2012-07-01 MED ORDER — MENTHOL 3 MG MT LOZG: 1.0000 | LOZENGE | OROMUCOSAL | Status: DC | PRN

## 2012-07-01 MED ORDER — TIOTROPIUM BROMIDE MONOHYDRATE 18 MCG IN CAPS
18.0000 ug | ORAL_CAPSULE | Freq: Every day | RESPIRATORY_TRACT | Status: DC
  Administered 2012-07-02 – 2012-07-04 (×2): 18 ug via RESPIRATORY_TRACT
  Filled 2012-07-01: qty 5

## 2012-07-01 MED ORDER — SODIUM CHLORIDE 0.9 % IV SOLN
INTRAVENOUS | Status: DC | PRN
  Administered 2012-07-01: 09:00:00 via INTRAVENOUS

## 2012-07-01 MED ORDER — HYDROCODONE-ACETAMINOPHEN 10-325 MG PO TABS
1.0000 | ORAL_TABLET | ORAL | Status: DC | PRN
  Administered 2012-07-01 – 2012-07-04 (×11): 1 via ORAL
  Filled 2012-07-01 (×12): qty 1

## 2012-07-01 MED ORDER — CEFAZOLIN SODIUM 1-5 GM-% IV SOLN
1.0000 g | Freq: Four times a day (QID) | INTRAVENOUS | Status: AC
  Administered 2012-07-01 (×2): 1 g via INTRAVENOUS
  Filled 2012-07-01 (×2): qty 50

## 2012-07-01 MED ORDER — WARFARIN SODIUM 5 MG PO TABS: 5.0000 mg | ORAL_TABLET | Freq: Every day | ORAL | Status: DC

## 2012-07-01 MED ORDER — WARFARIN - PHARMACIST DOSING INPATIENT: Freq: Every day | Status: DC

## 2012-07-01 MED ORDER — ARTIFICIAL TEARS OP OINT
TOPICAL_OINTMENT | OPHTHALMIC | Status: DC | PRN
  Administered 2012-07-01: 1 via OPHTHALMIC

## 2012-07-01 MED ORDER — CLONAZEPAM 0.5 MG PO TABS
0.5000 mg | ORAL_TABLET | Freq: Three times a day (TID) | ORAL | Status: DC
  Administered 2012-07-01 – 2012-07-04 (×6): 0.5 mg via ORAL
  Filled 2012-07-01 (×5): qty 1

## 2012-07-01 MED ORDER — CYCLOBENZAPRINE HCL 10 MG PO TABS
10.0000 mg | ORAL_TABLET | Freq: Three times a day (TID) | ORAL | Status: DC
  Administered 2012-07-01 – 2012-07-04 (×8): 10 mg via ORAL
  Filled 2012-07-01 (×12): qty 1

## 2012-07-01 MED ORDER — BUPIVACAINE-EPINEPHRINE PF 0.25-1:200000 % IJ SOLN
INTRAMUSCULAR | Status: AC
  Filled 2012-07-01: qty 30

## 2012-07-01 MED ORDER — LISINOPRIL 20 MG PO TABS
20.0000 mg | ORAL_TABLET | Freq: Every day | ORAL | Status: DC
  Administered 2012-07-01: 20 mg via ORAL
  Filled 2012-07-01 (×4): qty 1

## 2012-07-01 MED ORDER — ALBUTEROL SULFATE (5 MG/ML) 0.5% IN NEBU
2.5000 mg | INHALATION_SOLUTION | Freq: Four times a day (QID) | RESPIRATORY_TRACT | Status: DC
  Administered 2012-07-01: 2.5 mg via RESPIRATORY_TRACT
  Filled 2012-07-01 (×2): qty 0.5

## 2012-07-01 MED ORDER — ALBUTEROL SULFATE HFA 108 (90 BASE) MCG/ACT IN AERS: 2.0000 | INHALATION_SPRAY | Freq: Four times a day (QID) | RESPIRATORY_TRACT | Status: DC | PRN

## 2012-07-01 MED ORDER — METOCLOPRAMIDE HCL 5 MG/ML IJ SOLN
5.0000 mg | Freq: Three times a day (TID) | INTRAMUSCULAR | Status: DC | PRN
  Administered 2012-07-01: 10 mg via INTRAVENOUS
  Filled 2012-07-01: qty 2

## 2012-07-01 MED ORDER — PANTOPRAZOLE SODIUM 40 MG PO TBEC
40.0000 mg | DELAYED_RELEASE_TABLET | Freq: Every day | ORAL | Status: DC
  Administered 2012-07-01 – 2012-07-03 (×3): 40 mg via ORAL
  Filled 2012-07-01 (×3): qty 1

## 2012-07-01 MED ORDER — SENNOSIDES-DOCUSATE SODIUM 8.6-50 MG PO TABS: 1.0000 | ORAL_TABLET | Freq: Every evening | ORAL | Status: DC | PRN

## 2012-07-01 MED ORDER — MOMETASONE FURO-FORMOTEROL FUM 200-5 MCG/ACT IN AERO
2.0000 | INHALATION_SPRAY | Freq: Two times a day (BID) | RESPIRATORY_TRACT | Status: DC
  Administered 2012-07-02 – 2012-07-04 (×4): 2 via RESPIRATORY_TRACT
  Filled 2012-07-01: qty 8.8

## 2012-07-01 MED ORDER — ONDANSETRON HCL 4 MG PO TABS
4.0000 mg | ORAL_TABLET | Freq: Four times a day (QID) | ORAL | Status: DC | PRN
  Administered 2012-07-02 (×2): 4 mg via ORAL
  Filled 2012-07-01 (×2): qty 1

## 2012-07-01 MED ORDER — PHENOL 1.4 % MT LIQD: 1.0000 | OROMUCOSAL | Status: DC | PRN

## 2012-07-01 MED ORDER — FENTANYL CITRATE 0.05 MG/ML IJ SOLN
INTRAMUSCULAR | Status: DC | PRN
  Administered 2012-07-01 (×3): 100 ug via INTRAVENOUS
  Administered 2012-07-01: 50 ug via INTRAVENOUS

## 2012-07-01 MED ORDER — LACTATED RINGERS IV SOLN
INTRAVENOUS | Status: DC | PRN
  Administered 2012-07-01 (×2): via INTRAVENOUS

## 2012-07-01 MED ORDER — METOCLOPRAMIDE HCL 5 MG/ML IJ SOLN: 10.0000 mg | Freq: Once | INTRAMUSCULAR | Status: DC | PRN

## 2012-07-01 MED ORDER — ROCURONIUM BROMIDE 100 MG/10ML IV SOLN
INTRAVENOUS | Status: DC | PRN
  Administered 2012-07-01: 50 mg via INTRAVENOUS

## 2012-07-01 MED ORDER — OXYCODONE HCL 5 MG PO TABS: 5.0000 mg | ORAL_TABLET | Freq: Once | ORAL | Status: DC | PRN

## 2012-07-01 MED ORDER — GLYCOPYRROLATE 0.2 MG/ML IJ SOLN
INTRAMUSCULAR | Status: DC | PRN
  Administered 2012-07-01: 0.4 mg via INTRAVENOUS

## 2012-07-01 MED ORDER — OXYCODONE HCL 5 MG/5ML PO SOLN: 5.0000 mg | Freq: Once | ORAL | Status: DC | PRN

## 2012-07-01 MED ORDER — ONDANSETRON HCL 4 MG/2ML IJ SOLN
4.0000 mg | Freq: Four times a day (QID) | INTRAMUSCULAR | Status: DC | PRN
  Administered 2012-07-01: 4 mg via INTRAVENOUS
  Filled 2012-07-01 (×2): qty 2

## 2012-07-01 MED ORDER — SODIUM CHLORIDE 0.9 % IV SOLN
INTRAVENOUS | Status: DC
  Administered 2012-07-01: 12:00:00 via INTRAVENOUS

## 2012-07-01 MED ORDER — ACETAMINOPHEN 650 MG RE SUPP: 650.0000 mg | Freq: Four times a day (QID) | RECTAL | Status: DC | PRN

## 2012-07-01 MED ORDER — MIDAZOLAM HCL 5 MG/5ML IJ SOLN
INTRAMUSCULAR | Status: DC | PRN
  Administered 2012-07-01: 2 mg via INTRAVENOUS

## 2012-07-01 MED ORDER — ACETAMINOPHEN 325 MG PO TABS
650.0000 mg | ORAL_TABLET | Freq: Four times a day (QID) | ORAL | Status: DC | PRN
  Filled 2012-07-01: qty 1

## 2012-07-01 MED ORDER — HYDROMORPHONE HCL PF 1 MG/ML IJ SOLN
1.0000 mg | INTRAMUSCULAR | Status: DC | PRN
  Administered 2012-07-01 – 2012-07-02 (×2): 0.5 mg via INTRAVENOUS
  Administered 2012-07-03: 1 mg via INTRAVENOUS
  Filled 2012-07-01 (×3): qty 1

## 2012-07-01 MED ORDER — PROPOFOL 10 MG/ML IV BOLUS
INTRAVENOUS | Status: DC | PRN
  Administered 2012-07-01: 300 mg via INTRAVENOUS

## 2012-07-01 MED ORDER — LIDOCAINE HCL (CARDIAC) 20 MG/ML IV SOLN
INTRAVENOUS | Status: DC | PRN
  Administered 2012-07-01: 100 mg via INTRAVENOUS

## 2012-07-01 MED ORDER — ALBUMIN HUMAN 5 % IV SOLN
INTRAVENOUS | Status: DC | PRN
  Administered 2012-07-01: 10:00:00 via INTRAVENOUS

## 2012-07-01 MED ORDER — SODIUM CHLORIDE 0.9 % IR SOLN
Status: DC | PRN
  Administered 2012-07-01: 1000 mL

## 2012-07-01 SURGICAL SUPPLY — 60 items
BLADE SAW SAG 73X25 THK (BLADE) ×1
BLADE SAW SGTL 73X25 THK (BLADE) ×1 IMPLANT
BRUSH FEMORAL CANAL (MISCELLANEOUS) IMPLANT
CLOTH BEACON ORANGE TIMEOUT ST (SAFETY) ×2 IMPLANT
COVER BACK TABLE 24X17X13 BIG (DRAPES) IMPLANT
COVER SURGICAL LIGHT HANDLE (MISCELLANEOUS) ×2 IMPLANT
DRAPE INCISE IOBAN 66X45 STRL (DRAPES) ×1 IMPLANT
DRAPE ORTHO SPLIT 77X108 STRL (DRAPES) ×4
DRAPE SURG ORHT 6 SPLT 77X108 (DRAPES) ×2 IMPLANT
DRAPE U-SHAPE 47X51 STRL (DRAPES) ×2 IMPLANT
DRSG ADAPTIC 3X8 NADH LF (GAUZE/BANDAGES/DRESSINGS) ×2 IMPLANT
DRSG PAD ABDOMINAL 8X10 ST (GAUZE/BANDAGES/DRESSINGS) ×4 IMPLANT
DURAPREP 26ML APPLICATOR (WOUND CARE) ×2 IMPLANT
ELECT CAUTERY BLADE 6.4 (BLADE) ×2 IMPLANT
ELECT REM PT RETURN 9FT ADLT (ELECTROSURGICAL) ×2
ELECTRODE REM PT RTRN 9FT ADLT (ELECTROSURGICAL) ×1 IMPLANT
EVACUATOR 1/8 PVC DRAIN (DRAIN) ×1 IMPLANT
FACESHIELD LNG OPTICON STERILE (SAFETY) ×4 IMPLANT
GLOVE BIOGEL PI IND STRL 8 (GLOVE) ×2 IMPLANT
GLOVE BIOGEL PI INDICATOR 8 (GLOVE) ×2
GLOVE ORTHO TXT STRL SZ7.5 (GLOVE) ×4 IMPLANT
GLOVE SURG ORTHO 8.0 STRL STRW (GLOVE) ×4 IMPLANT
GOWN PREVENTION PLUS XLARGE (GOWN DISPOSABLE) ×4 IMPLANT
GOWN PREVENTION PLUS XXLARGE (GOWN DISPOSABLE) ×2 IMPLANT
GOWN STRL NON-REIN LRG LVL3 (GOWN DISPOSABLE) ×2 IMPLANT
HANDPIECE INTERPULSE COAX TIP (DISPOSABLE)
HOOD PEEL AWAY FACE SHEILD DIS (HOOD) ×2 IMPLANT
IMMOBILIZER KNEE 20 (SOFTGOODS)
IMMOBILIZER KNEE 20 THIGH 36 (SOFTGOODS) IMPLANT
IMMOBILIZER KNEE 22 UNIV (SOFTGOODS) ×2 IMPLANT
IMMOBILIZER KNEE 24 THIGH 36 (MISCELLANEOUS) IMPLANT
IMMOBILIZER KNEE 24 UNIV (MISCELLANEOUS)
KIT BASIN OR (CUSTOM PROCEDURE TRAY) ×2 IMPLANT
KIT ROOM TURNOVER OR (KITS) ×2 IMPLANT
MANIFOLD NEPTUNE II (INSTRUMENTS) ×3 IMPLANT
NDL MAYO TROCAR (NEEDLE) ×1 IMPLANT
NEEDLE 22X1 1/2 (OR ONLY) (NEEDLE) ×2 IMPLANT
NEEDLE MAYO TROCAR (NEEDLE) ×2 IMPLANT
NS IRRIG 1000ML POUR BTL (IV SOLUTION) ×2 IMPLANT
PACK TOTAL JOINT (CUSTOM PROCEDURE TRAY) ×2 IMPLANT
PAD ARMBOARD 7.5X6 YLW CONV (MISCELLANEOUS) ×4 IMPLANT
PRESSURIZER FEMORAL UNIV (MISCELLANEOUS) IMPLANT
SET HNDPC FAN SPRY TIP SCT (DISPOSABLE) IMPLANT
SPONGE GAUZE 4X4 12PLY (GAUZE/BANDAGES/DRESSINGS) ×2 IMPLANT
SPONGE LAP 18X18 X RAY DECT (DISPOSABLE) ×1 IMPLANT
STAPLER VISISTAT 35W (STAPLE) ×2 IMPLANT
SUCTION FRAZIER TIP 10 FR DISP (SUCTIONS) ×2 IMPLANT
SUT ETHIBOND NAB CT1 #1 30IN (SUTURE) ×7 IMPLANT
SUT VIC AB 0 CT1 27 (SUTURE) ×4
SUT VIC AB 0 CT1 27XBRD ANBCTR (SUTURE) IMPLANT
SUT VIC AB 1 CTB1 27 (SUTURE) ×4 IMPLANT
SUT VIC AB 2-0 CT1 27 (SUTURE) ×4
SUT VIC AB 2-0 CT1 TAPERPNT 27 (SUTURE) ×2 IMPLANT
SYR CONTROL 10ML LL (SYRINGE) ×2 IMPLANT
TAPE CLOTH SURG 6X10 WHT LF (GAUZE/BANDAGES/DRESSINGS) ×1 IMPLANT
TOWEL OR 17X24 6PK STRL BLUE (TOWEL DISPOSABLE) ×2 IMPLANT
TOWEL OR 17X26 10 PK STRL BLUE (TOWEL DISPOSABLE) ×2 IMPLANT
TOWER CARTRIDGE SMART MIX (DISPOSABLE) IMPLANT
TRAY FOLEY CATH 14FR (SET/KITS/TRAYS/PACK) ×2 IMPLANT
WATER STERILE IRR 1000ML POUR (IV SOLUTION) ×6 IMPLANT

## 2012-07-01 NOTE — Preoperative (Signed)
Beta Blockers   Reason not to administer Beta Blockers:Not Applicable 

## 2012-07-01 NOTE — Progress Notes (Signed)
Rec'd report from Octaviano Batty., relieving for lunch break

## 2012-07-01 NOTE — Anesthesia Postprocedure Evaluation (Signed)
Anesthesia Post Note  Patient: [name]  Procedure(s) Performed: Procedure(s) (LRB): TOTAL HIP ARTHROPLASTY WITH AUTOGRAFT (Right)  Anesthesia type: General  Patient location: PACU  Post pain: Pain level controlled  Post assessment: Patient's Cardiovascular Status Stable  Last Vitals:  Filed Vitals:   07/01/12 1134  BP: 129/77  Pulse: 76  Temp:   Resp: 15    Post vital signs: Reviewed and stable  Level of consciousness: alert  Complications: No apparent anesthesia complications

## 2012-07-01 NOTE — H&P (View-Only) (Signed)
TOTAL HIP ADMISSION H&P  Patient is admitted for right total hip arthroplasty.  Subjective:  Chief Complaint: right hip pain  HPI: Paulino Rily, 52 y.o. 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  Onset of symptoms was gradual starting 5 years ago with gradually worsening course since that time.The patient noted no past surgery on the right hip(s).  Patient currently rates pain in the right hip at 10 out of 10 with activity. Patient has night pain, worsening of pain with activity and weight bearing, pain that interfers with activities of daily living and pain with passive range of motion. Patient has evidence of joint space narrowing and collapse femoral head by imaging studies. This condition presents safety issues increasing the risk of falls. There is no current active infection.  Patient Active Problem List   Diagnosis Date Noted  . Hip fracture 07/30/2011  . ANKLE PAIN, RIGHT 09/12/2007  . FOOT PAIN, RIGHT 09/12/2007  . CA IN SITU, CERVIX UTERI 09/21/2006  . LATERAL EPICONDYLITIS, LEFT 09/16/2006  . ALLERGIC RHINITIS 06/15/2006  . INFECTION, VIRAL NOS 04/06/2006  . TOBACCO USE 04/06/2006  . CHRONIC OBSTRUCTIVE PULMONARY DISEASE, ACUTE EXACERBATION 04/06/2006  . ANEMIA-NOS 03/16/2006  . ANXIETY 03/16/2006  . DEPRESSION 03/16/2006  . MIGRAINE HEADACHE 03/16/2006  . CARPAL TUNNEL SYNDROME 03/16/2006  . SUPRAVENTRICULAR TACHYCARDIA 03/16/2006  . CARDIAC ARRHYTHMIA 03/16/2006  . ASTHMA 03/16/2006  . COPD 03/16/2006  . GERD 03/16/2006  . OVERACTIVE BLADDER 03/16/2006  . OSTEOARTHRITIS 03/16/2006  . LOW BACK PAIN 03/16/2006  . SEIZURE DISORDER 03/16/2006   Past Medical History   Diagnosis Date  . Asthma   . Bronchitis   . Hypertension   . GERD (gastroesophageal reflux disease)   . Chronic back pain   . Shortness of breath   . Headache   . Anxiety   . COPD (chronic obstructive pulmonary disease)     O2 2 liters at bedtime  . Sleep apnea     stops breathing at night sometimes, uses o2 at 2L HS  . Seizures     hx 1 seizure 2000, none since  . Sciatic nerve pain   . Arthritis     Past Surgical History  Procedure Laterality Date  . Cholecystectomy    . Abdominal hysterectomy    . Tubal ligation    . Total hip arthroplasty  07/31/2011    Procedure: TOTAL HIP ARTHROPLASTY;  Surgeon: Thera Flake., MD;  Location: [facility] OR;  Service: Orthopedics;  Laterality: Left;     (Not in a hospital admission) No Known Allergies  History  Substance Use Topics  . Smoking status: Current Every Day Smoker -- 0.50 packs/day for 33 years    Types: Cigarettes  . Smokeless tobacco: Never Used  . Alcohol Use: No    No family history on file.   Review of Systems  Constitutional: Negative.   HENT: Negative.   Eyes: Negative.   Respiratory: Positive for shortness of breath. Negative for cough, hemoptysis and sputum production.   Cardiovascular: Negative.   Gastrointestinal: Negative.   Genitourinary: Negative.   Musculoskeletal: Positive for back pain and joint pain.  Skin: Negative.   Neurological: Negative.   Endo/Heme/Allergies: Negative.   Psychiatric/Behavioral: Negative for depression and suicidal ideas.  Objective:  Physical Exam  Constitutional: She is oriented to person, place, and time. She appears well-developed and well-nourished. No distress.  HENT:  Head: Normocephalic and atraumatic.  Nose: Nose normal.  Eyes: Conjunctivae and EOM are normal. Pupils are equal, round, and reactive to light.  Neck: Normal range of motion. Neck supple.  Cardiovascular: Normal rate, regular rhythm, normal heart sounds and intact distal pulses.   Respiratory: Effort  normal and breath sounds normal. No respiratory distress. She has no wheezes. She exhibits no tenderness.  GI: Soft. Bowel sounds are normal. She exhibits no distension. There is no tenderness.  Musculoskeletal:       Right hip: She exhibits decreased range of motion, decreased strength, tenderness and bony tenderness. She exhibits no deformity and no laceration.  Lymphadenopathy:    She has no cervical adenopathy.  Neurological: She is alert and oriented to person, place, and time. No cranial nerve deficit.  Skin: Skin is warm and dry. No rash noted. No erythema.  Psychiatric: She has a normal mood and affect. Her behavior is normal.    Vital signs in last 24 hours: @VSRANGES @  Labs:   Estimated body mass index is 25.84 kg/(m^2) as calculated from the following:   Height as of 07/15/11: 5\' 6"  (1.676 m).   Weight as of 07/15/11: 72.576 kg (160 lb).   Imaging Review Plain radiographs demonstrate severe degenerative joint disease of the right hip(s). The bone quality appears to be good for age and reported activity level.  Assessment/Plan:  End stage arthritis, right hip(s)  The patient history, physical examination, clinical judgement of the provider and imaging studies are consistent with end stage degenerative joint disease of the right hip(s) and total hip arthroplasty is deemed medically necessary. The treatment options including medical management, injection therapy, arthroscopy and arthroplasty were discussed at length. The risks and benefits of total hip arthroplasty were presented and reviewed. The risks due to aseptic loosening, infection, stiffness, dislocation/subluxation,  thromboembolic complications and other imponderables were discussed.  The patient acknowledged the explanation, agreed to proceed with the plan and consent was signed. Patient is being admitted for inpatient treatment for surgery, pain control, PT, OT, prophylactic antibiotics, VTE prophylaxis, progressive  ambulation and ADL's and discharge planning.The patient is planning to be discharged home with home health services

## 2012-07-01 NOTE — Anesthesia Procedure Notes (Signed)
Procedure Name: Intubation Date/Time: 07/01/2012 8:15 AM Performed by: Fransisca Kaufmann Pre-anesthesia Checklist: Patient identified, Emergency Drugs available, Suction available, Patient being monitored and Timeout performed Patient Re-evaluated:Patient Re-evaluated prior to inductionOxygen Delivery Method: Circle system utilized Preoxygenation: Pre-oxygenation with 100% oxygen Intubation Type: IV induction Ventilation: Mask ventilation without difficulty Laryngoscope Size: Miller and 2 Grade View: Grade I Tube type: Oral Number of attempts: 1 Airway Equipment and Method: Stylet Placement Confirmation: ETT inserted through vocal cords under direct vision,  breath sounds checked- equal and bilateral and positive ETCO2 Secured at: 21 cm Tube secured with: Tape Dental Injury: Teeth and Oropharynx as per pre-operative assessment

## 2012-07-01 NOTE — Transfer of Care (Signed)
Immediate Anesthesia Transfer of Care Note  Patient: [name]  Procedure(s) Performed: Procedure(s): TOTAL HIP ARTHROPLASTY WITH AUTOGRAFT (Right)  Patient Location: PACU  Anesthesia Type:General  Level of Consciousness: awake, alert , oriented and sedated  Airway & Oxygen Therapy: Patient Spontanous Breathing and Patient connected to nasal cannula oxygen  Post-op Assessment: Report given to PACU RN, Post -op Vital signs reviewed and stable and Patient moving all extremities  Post vital signs: Reviewed and stable  Complications: No apparent anesthesia complications

## 2012-07-01 NOTE — Progress Notes (Signed)
ANTICOAGULATION CONSULT NOTE - Initial Consult  Pharmacy Consult for Coumadin Indication: VTE prophylaxis  No Known Allergies  Patient Measurements:     Vital Signs: Temp: 97.9 F (36.6 C) (05/09 1304) Temp src: Oral (05/09 0611) BP: 133/86 mmHg (05/09 1304) Pulse Rate: 89 (05/09 1304)  Labs: No results found for this basename: HGB, HCT, PLT, APTT, LABPROT, INR, HEPARINUNFRC, CREATININE, CKTOTAL, CKMB, TROPONINI,  in the last 72 hours  The CrCl is unknown because both a height and weight (above a minimum accepted value) are required for this calculation.   Medical History: Past Medical History  Diagnosis Date  . Asthma   . Bronchitis   . Hypertension   . GERD (gastroesophageal reflux disease)   . Chronic back pain   . Shortness of breath   . Headache   . Anxiety   . COPD (chronic obstructive pulmonary disease)     O2 2 liters at bedtime  . Sleep apnea     stops breathing at night sometimes, uses o2 at 2L HS  . Seizures     hx 1 seizure 2000, none since  . Sciatic nerve pain   . Arthritis     Medications:  Scheduled:  . albuterol  2.5 mg Nebulization Q6H  .  ceFAZolin (ANCEF) IV  1 g Intravenous Q6H  . [COMPLETED]  ceFAZolin (ANCEF) IV  2 g Intravenous On Call to OR  . clonazePAM  0.5 mg Oral TID  . cyclobenzaprine  10 mg Oral TID  . docusate sodium  100 mg Oral BID  . HYDROmorphone      . HYDROmorphone      . lisinopril  20 mg Oral Daily  . mometasone-formoterol  2 puff Inhalation BID  . pantoprazole  40 mg Oral Q1200  . tiotropium  18 mcg Inhalation Daily  . [DISCONTINUED] acetaminophen  1,000 mg Intravenous Once  . [DISCONTINUED] chlorhexidine  60 mL Topical Once  . [DISCONTINUED] chlorhexidine  60 mL Topical Once    Assessment: 52 yr old female on coumadin for VTE prophylaxis s/p THA.  Baseline INR=1.13  Goal of Therapy:  INR 2-3 Monitor platelets by anticoagulation protocol: Yes   Plan:  Coumadin 7.5mg  po x 1 dose tonight. Daily  PT/INR. Coumadin book and video to patient.    Wendie Simmer, PharmD, BCPS Clinical Pharmacist  Pager: [phone]

## 2012-07-01 NOTE — Interval H&P Note (Signed)
History and Physical Interval Note:  07/01/2012 7:39 AM  [name]  has presented today for surgery, with the diagnosis of Right hip OA  The various methods of treatment have been discussed with the patient and family. After consideration of risks, benefits and other options for treatment, the patient has consented to  Procedure(s): TOTAL HIP ARTHROPLASTY WITH AUTOGRAFT (Right) as a surgical intervention .  The patient's history has been reviewed, patient examined, no change in status, stable for surgery.  I have reviewed the patient's chart and labs.  Questions were answered to the patient's satisfaction.     Chauncey Bruno JR,W D

## 2012-07-01 NOTE — Brief Op Note (Signed)
07/01/2012  10:39 AM  PATIENT:  [name]  52 y.o. female  PRE-OPERATIVE DIAGNOSIS:  Right hip OA  POST-OPERATIVE DIAGNOSIS:  Right hip OA  PROCEDURE:  Procedure(s): TOTAL HIP ARTHROPLASTY WITH AUTOGRAFT (Right)  SURGEON:  Surgeon(s) and Role:    * W D Caffrey Jr., MD - Primary  PHYSICIAN ASSISTANT:   ASSISTANTS: Margart Sickles, PA-C   ANESTHESIA:   general  EBL:  Total I/O In: 2095 [I.V.:1650; Blood:195; IV Piggyback:250] Out: 850 [Urine:250; Blood:600]  BLOOD ADMINISTERED:1unit PLTS  DRAINS: none   LOCAL MEDICATIONS USED:  MARCAINE     SPECIMEN:  No Specimen  DISPOSITION OF SPECIMEN:  N/A  COUNTS:  YES  TOURNIQUET:  * No tourniquets in log *  DICTATION: .Other Dictation: Dictation Number unknown  PLAN OF CARE: Admit to inpatient   PATIENT DISPOSITION:  PACU - hemodynamically stable.   Delay start of Pharmacological VTE agent (>24hrs) due to surgical blood loss or risk of bleeding: yes

## 2012-07-01 NOTE — Progress Notes (Signed)
UR COMPLETED  

## 2012-07-01 NOTE — Anesthesia Preprocedure Evaluation (Signed)
Anesthesia Evaluation  Patient identified by MRN, date of birth, ID band Patient awake    Reviewed: Allergy & Precautions, H&P , NPO status , Patient's Chart, lab work & pertinent test results, reviewed documented beta blocker date and time   Airway Mallampati: II TM Distance: >3 FB Neck ROM: full    Dental   Pulmonary shortness of breath and with exertion, asthma , sleep apnea , COPD COPD inhaler, Current Smoker,  breath sounds clear to auscultation        Cardiovascular hypertension, On Medications + dysrhythmias Supra Ventricular Tachycardia Rhythm:regular     Neuro/Psych  Headaches, Seizures -,  PSYCHIATRIC DISORDERS  Neuromuscular disease    GI/Hepatic Neg liver ROS, GERD-  Medicated and Controlled,  Endo/Other  negative endocrine ROS  Renal/GU negative Renal ROS  negative genitourinary   Musculoskeletal   Abdominal   Peds  Hematology  (+) anemia ,   Anesthesia Other Findings See surgeon's H&P   Reproductive/Obstetrics negative OB ROS                           Anesthesia Physical Anesthesia Plan  ASA: III  Anesthesia Plan: General   Post-op Pain Management:    Induction: Intravenous  Airway Management Planned: Oral ETT  Additional Equipment:   Intra-op Plan:   Post-operative Plan: Extubation in OR  Informed Consent: I have reviewed the patients History and Physical, chart, labs and discussed the procedure including the risks, benefits and alternatives for the proposed anesthesia with the patient or authorized representative who has indicated his/her understanding and acceptance.   Dental Advisory Given  Plan Discussed with: CRNA and Surgeon  Anesthesia Plan Comments:         Anesthesia Quick Evaluation

## 2012-07-01 NOTE — H&P (Signed)
TOTAL HIP ADMISSION H&P  Patient is admitted for right total hip arthroplasty.  Subjective:  Chief Complaint: right hip pain  HPI: [name], 52 y.o. 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  Onset of symptoms was gradual starting 5 years ago with gradually worsening course since that time.The patient noted no past surgery on the right hip(s).  Patient currently rates pain in the right hip at 10 out of 10 with activity. Patient has night pain, worsening of pain with activity and weight bearing, pain that interfers with activities of daily living and pain with passive range of motion. Patient has evidence of joint space narrowing and collapse femoral head by imaging studies. This condition presents safety issues increasing the risk of falls. There is no current active infection.  Patient Active Problem List   Diagnosis Date Noted  . Hip fracture 07/30/2011  . ANKLE PAIN, RIGHT 09/12/2007  . FOOT PAIN, RIGHT 09/12/2007  . CA IN SITU, CERVIX UTERI 09/21/2006  . LATERAL EPICONDYLITIS, LEFT 09/16/2006  . ALLERGIC RHINITIS 06/15/2006  . INFECTION, VIRAL NOS 04/06/2006  . TOBACCO USE 04/06/2006  . CHRONIC OBSTRUCTIVE PULMONARY DISEASE, ACUTE EXACERBATION 04/06/2006  . ANEMIA-NOS 03/16/2006  . ANXIETY 03/16/2006  . DEPRESSION 03/16/2006  . MIGRAINE HEADACHE 03/16/2006  . CARPAL TUNNEL SYNDROME 03/16/2006  . SUPRAVENTRICULAR TACHYCARDIA 03/16/2006  . CARDIAC ARRHYTHMIA 03/16/2006  . ASTHMA 03/16/2006  . COPD 03/16/2006  . GERD 03/16/2006  . OVERACTIVE BLADDER 03/16/2006  . OSTEOARTHRITIS 03/16/2006  . LOW BACK PAIN 03/16/2006  . SEIZURE DISORDER 03/16/2006   Past Medical History   Diagnosis Date  . Asthma   . Bronchitis   . Hypertension   . GERD (gastroesophageal reflux disease)   . Chronic back pain   . Shortness of breath   . Headache   . Anxiety   . COPD (chronic obstructive pulmonary disease)     O2 2 liters at bedtime  . Sleep apnea     stops breathing at night sometimes, uses o2 at 2L HS  . Seizures     hx 1 seizure 2000, none since  . Sciatic nerve pain   . Arthritis     Past Surgical History  Procedure Laterality Date  . Cholecystectomy    . Abdominal hysterectomy    . Tubal ligation    . Total hip arthroplasty  07/31/2011    Procedure: TOTAL HIP ARTHROPLASTY;  Surgeon: W D Caffrey Jr., MD;  Location: [facility] OR;  Service: Orthopedics;  Laterality: Left;     (Not in a hospital admission) No Known Allergies  History  Substance Use Topics  . Smoking status: Current Every Day Smoker -- 0.50 packs/day for 33 years    Types: Cigarettes  . Smokeless tobacco: Never Used  . Alcohol Use: No    No family history on file.   Review of Systems  Constitutional: Negative.   HENT: Negative.   Eyes: Negative.   Respiratory: Positive for shortness of breath. Negative for cough, hemoptysis and sputum production.   Cardiovascular: Negative.   Gastrointestinal: Negative.   Genitourinary: Negative.   Musculoskeletal: Positive for back pain and joint pain.  Skin: Negative.   Neurological: Negative.   Endo/Heme/Allergies: Negative.   Psychiatric/Behavioral: Negative for depression and suicidal ideas.      Objective:  Physical Exam  Constitutional: She is oriented to person, place, and time. She appears well-developed and well-nourished. No distress.  HENT:  Head: Normocephalic and atraumatic.  Nose: Nose normal.  Eyes: Conjunctivae and EOM are normal. Pupils are equal, round, and reactive to light.  Neck: Normal range of motion. Neck supple.  Cardiovascular: Normal rate, regular rhythm, normal heart sounds and intact distal pulses.   Respiratory: Effort  normal and breath sounds normal. No respiratory distress. She has no wheezes. She exhibits no tenderness.  GI: Soft. Bowel sounds are normal. She exhibits no distension. There is no tenderness.  Musculoskeletal:       Right hip: She exhibits decreased range of motion, decreased strength, tenderness and bony tenderness. She exhibits no deformity and no laceration.  Lymphadenopathy:    She has no cervical adenopathy.  Neurological: She is alert and oriented to person, place, and time. No cranial nerve deficit.  Skin: Skin is warm and dry. No rash noted. No erythema.  Psychiatric: She has a normal mood and affect. Her behavior is normal.    Vital signs in last 24 hours: @VSRANGES@  Labs:   Estimated body mass index is 25.84 kg/(m^2) as calculated from the following:   Height as of 07/15/11: 5' 6" (1.676 m).   Weight as of 07/15/11: 72.576 kg (160 lb).   Imaging Review Plain radiographs demonstrate severe degenerative joint disease of the right hip(s). The bone quality appears to be good for age and reported activity level.  Assessment/Plan:  End stage arthritis, right hip(s)  The patient history, physical examination, clinical judgement of the provider and imaging studies are consistent with end stage degenerative joint disease of the right hip(s) and total hip arthroplasty is deemed medically necessary. The treatment options including medical management, injection therapy, arthroscopy and arthroplasty were discussed at length. The risks and benefits of total hip arthroplasty were presented and reviewed. The risks due to aseptic loosening, infection, stiffness, dislocation/subluxation,  thromboembolic complications and other imponderables were discussed.  The patient acknowledged the explanation, agreed to proceed with the plan and consent was signed. Patient is being admitted for inpatient treatment for surgery, pain control, PT, OT, prophylactic antibiotics, VTE prophylaxis, progressive  ambulation and ADL's and discharge planning.The patient is planning to be discharged home with home health services 

## 2012-07-02 LAB — CBC
HCT: 28.6 % — ABNORMAL LOW (ref 36.0–46.0)
HCT: 25.9 % — ABNORMAL LOW (ref 36.0–46.0)
Hemoglobin: 8.9 g/dL — ABNORMAL LOW (ref 12.0–15.0)
MCV: 87.2 fL (ref 78.0–100.0)
MCV: 88.1 fL (ref 78.0–100.0)
Platelets: 115 10*3/uL — ABNORMAL LOW (ref 150–400)
RBC: 3.28 MIL/uL — ABNORMAL LOW (ref 3.87–5.11)
RDW: 16.2 % — ABNORMAL HIGH (ref 11.5–15.5)
WBC: 8.9 10*3/uL (ref 4.0–10.5)
WBC: 8.3 10*3/uL (ref 4.0–10.5)

## 2012-07-02 LAB — BASIC METABOLIC PANEL
CO2: 27 mEq/L (ref 19–32)
Chloride: 101 mEq/L (ref 96–112)
Creatinine, Ser: 0.71 mg/dL (ref 0.50–1.10)
Potassium: 3.9 mEq/L (ref 3.5–5.1)

## 2012-07-02 LAB — PREPARE PLATELET PHERESIS: Unit division: 0

## 2012-07-02 MED ORDER — ACETAMINOPHEN 325 MG PO TABS
650.0000 mg | ORAL_TABLET | Freq: Once | ORAL | Status: AC
  Administered 2012-07-02: 325 mg via ORAL

## 2012-07-02 MED ORDER — DIPHENHYDRAMINE HCL 25 MG PO CAPS
25.0000 mg | ORAL_CAPSULE | Freq: Once | ORAL | Status: AC
  Administered 2012-07-02: 25 mg via ORAL
  Filled 2012-07-02: qty 1

## 2012-07-02 MED ORDER — FUROSEMIDE 10 MG/ML IJ SOLN
20.0000 mg | Freq: Once | INTRAMUSCULAR | Status: AC
  Administered 2012-07-03: 20 mg via INTRAVENOUS
  Filled 2012-07-02: qty 2

## 2012-07-02 MED ORDER — WARFARIN SODIUM 7.5 MG PO TABS
7.5000 mg | ORAL_TABLET | Freq: Once | ORAL | Status: AC
  Administered 2012-07-02: 7.5 mg via ORAL
  Filled 2012-07-02: qty 1

## 2012-07-02 MED ORDER — SODIUM CHLORIDE 0.9 % IV SOLN: Freq: Once | INTRAVENOUS | Status: DC

## 2012-07-02 NOTE — Progress Notes (Signed)
Physical Therapy Treatment Patient Details Name: [name] MRN: [id number] DOB: 11/27/1960 Today's Date: 07/02/2012 Time: 4098-1191 PT Time Calculation (min): 28 min  PT Assessment / Plan / Recommendation Comments on Treatment Session  Pt cont to be limited in mobility and therapy due to fatigue. Pt did not c/o dizziness with activity. Will cont to f/u with pt to increase mobility and independence. Pt states she will be going home  tomorow or monday     Follow Up Recommendations  Home health PT;Supervision/Assistance - 24 hour     Does the patient have the potential to tolerate intense rehabilitation     Barriers to Discharge        Equipment Recommendations  None recommended by PT    Recommendations for Other Services    Frequency 7X/week   Plan Discharge plan remains appropriate;Frequency remains appropriate    Precautions / Restrictions Precautions Precautions: Posterior Hip;Fall Precaution Booklet Issued: Yes (comment) Precaution Comments: pt able to recall 1/3 hip precautions indepenently Required Braces or Orthoses: Knee Immobilizer - Right Knee Immobilizer - Right: Other (comment) (until d/c by MD ) Restrictions Weight Bearing Restrictions: Yes RLE Weight Bearing: Weight bearing as tolerated   Pertinent Vitals/Pain C/o pain in top of knee 7/10 with activity; BP Sitting EOB 116/57; BP standing 108/65; BP when returned to supine 95/45; no c/o dizziness or nausea.     Mobility  Bed Mobility Bed Mobility: Supine to Sit;Sitting - Scoot to Edge of Bed;Sit to Supine Supine to Sit: 4: Min assist;HOB elevated;With rails Sitting - Scoot to Edge of Bed: 4: Min assist Sit to Supine: 4: Min assist;HOB flat;With rail Details for Bed Mobility Assistance: assistance for R LE; c/o increased pain; vc's to maintain hip precautions  Transfers Transfers: Sit to Stand;Stand to Sit Sit to Stand: 4: Min guard;From bed;From elevated surface Stand to Sit: 4: Min guard;To bed Details  for Transfer Assistance: vc's for hand placment and safety with RW  Ambulation/Gait Ambulation/Gait Assistance: 4: Min guard Ambulation Distance (Feet): 30 Feet Assistive device: Rolling walker Ambulation/Gait Assistance Details: vc's for gt sequencing and RW sequencing; pt did not c/o dizziness; vc's for upright posture; required min guard to steady; demo mild balance deficits due to increased pain with WB through R LE  Gait Pattern: Step-to pattern;Decreased step length - left;Decreased stance time - right;Decreased stride length;Trunk flexed;Antalgic Gait velocity: decreased Stairs: No Wheelchair Mobility Wheelchair Mobility: No    Exercises Total Joint Exercises Ankle Circles/Pumps: AROM;Both;10 reps;Supine Quad Sets: AROM;10 reps;Right;Supine Heel Slides: AAROM;Right;10 reps;Supine Hip ABduction/ADduction: AAROM;Right;10 reps;Supine (c/o increased pain)   PT Diagnosis:    PT Problem List:   PT Treatment Interventions:     PT Goals Acute Rehab PT Goals PT Goal Formulation: With patient Time For Goal Achievement: 07/06/12 Potential to Achieve Goals: Good PT Goal: Supine/Side to Sit - Progress: Progressing toward goal PT Goal: Sit to Supine/Side - Progress: Progressing toward goal PT Goal: Sit to Stand - Progress: Progressing toward goal PT Goal: Stand to Sit - Progress: Progressing toward goal PT Goal: Ambulate - Progress: Progressing toward goal PT Goal: Perform Home Exercise Program - Progress: Progressing toward goal Additional Goals PT Goal: Additional Goal #1 - Progress: Progressing toward goal  Visit Information  Last PT Received On: 07/02/12 Assistance Needed: +1    Subjective Data  Subjective: "i just cant get over this sleepiness. I want to try and walk to the door"  Patient Stated Goal: home with family/friends    Cognition  Cognition Arousal/Alertness:  Suspect due to medications (drowsy) Behavior During Therapy: WFL for tasks assessed/performed Overall  Cognitive Status: Within Functional Limits for tasks assessed    Balance  Balance Balance Assessed: No  End of Session PT - End of Session Equipment Utilized During Treatment: Gait belt;Right knee immobilizer Activity Tolerance: Patient limited by fatigue Patient left: in chair;with call bell/phone within reach;with family/visitor present Nurse Communication: Mobility status   GP     Donell Sievert, [place] [phone] 07/02/2012, 2:46 PM

## 2012-07-02 NOTE — Progress Notes (Signed)
Occupational Therapy Evaluation Patient Details Name: [name] MRN: [id number] DOB: 25-Jan-1961 Today's Date: 07/02/2012 Time: 4098-1191 OT Time Calculation (min): 19 min  OT Assessment / Plan / Recommendation Clinical Impression  52 yo s/p R THA. Post precautions. WBAT. PTA, pt lived with family who can provide 24/7 S. Pt will benefit from hip kit to increase independence with ADL and mobility for ADL. Will see in am. Do not anticipate OT needs after D/C. Pt will benefit from skilled OT services to facilitate D/C to home with intermittent S due to below deficits.    OT Assessment  Patient needs continued OT Services    Follow Up Recommendations  No OT follow up    Barriers to Discharge None    Equipment Recommendations  Other (comment) (hip kit)    Recommendations for Other Services    Frequency  Min 2X/week    Precautions / Restrictions Precautions Precautions: Posterior Hip;Fall Precaution Booklet Issued: Yes (comment) Precaution Comments: pt aware of hip precautions due to previous hip surgery; able to independently recall 2/3 precautions; given handout and educated  Required Braces or Orthoses: Knee Immobilizer - Right Knee Immobilizer - Right: Other (comment) (until d/c by MD ) Restrictions Weight Bearing Restrictions: Yes RLE Weight Bearing: Weight bearing as tolerated   Pertinent Vitals/Pain c/o /r hip pain @4     ADL  Grooming: Set up Where Assessed - Grooming: Unsupported standing Upper Body Bathing: Set up Where Assessed - Upper Body Bathing: Unsupported sitting Lower Body Bathing: Maximal assistance Where Assessed - Lower Body Bathing: Supported sit to stand Upper Body Dressing: Set up Where Assessed - Upper Body Dressing: Unsupported sitting Lower Body Dressing: Maximal assistance Where Assessed - Lower Body Dressing: Unsupported sit to stand Toilet Transfer: Minimal assistance Toilet Transfer Method: Sit to stand Toilet Transfer Equipment: Bedside  commode Toileting - Clothing Manipulation and Hygiene: Supervision/safety Where Assessed - Glass blower/designer Manipulation and Hygiene: Standing Equipment Used: Gait belt;Rolling walker Transfers/Ambulation Related to ADLs: min A ADL Comments: Pt able to recall 2/3 hip precautions. Pt will need AE hip kit for d/C. Pt need max vc to follow precautions during bed moiblity.    OT Diagnosis: Generalized weakness;Acute pain  OT Problem List: Decreased strength;Decreased range of motion;Decreased activity tolerance;Decreased safety awareness;Decreased knowledge of precautions;Pain OT Treatment Interventions: Self-care/ADL training;Energy conservation;DME and/or AE instruction;Therapeutic activities;Patient/family education   OT Goals Acute Rehab OT Goals OT Goal Formulation: With patient Time For Goal Achievement: 07/16/12 Potential to Achieve Goals: Good ADL Goals Pt Will Perform Lower Body Bathing: with set-up;with supervision;with caregiver independent in assisting;Sit to stand from chair;with adaptive equipment;with cueing (comment type and amount) ADL Goal: Lower Body Bathing - Progress: Goal set today Pt Will Perform Lower Body Dressing: with set-up;with supervision;Sit to stand from chair;with adaptive equipment;with cueing (comment type and amount) ADL Goal: Lower Body Dressing - Progress: Goal set today Pt Will Transfer to Toilet: with modified independence;with DME;3-in-1;Maintaining hip precautions ADL Goal: Toilet Transfer - Progress: Goal set today Additional ADL Goal #1: Pt will demonstrate 3/3 hip precautions during ADL task. ADL Goal: Additional Goal #1 - Progress: Goal set today  Visit Information  Last OT Received On: 07/02/12 Assistance Needed: +1    Subjective Data      Prior Functioning     Home Living Lives With: Family Available Help at Discharge: Family;Friend(s);Available 24 hours/day Type of Home: Mobile home Home Access: Ramped entrance Home Layout: One  level Bathroom Shower/Tub: Engineer, manufacturing systems:  (uses 3 in 1)  Bathroom Accessibility: Yes How Accessible: Accessible via walker Home Adaptive Equipment: Bedside commode/3-in-1;Walker - rolling;Shower chair with back Additional Comments: Pt had L THA last year  Prior Function Level of Independence: Independent with assistive device(s) Able to Take Stairs?: Yes Driving: Yes Vocation: On disability Communication Communication: No difficulties Dominant Hand: Right         Vision/Perception     Cognition  Cognition Arousal/Alertness: Awake/alert Behavior During Therapy: WFL for tasks assessed/performed Overall Cognitive Status: Within Functional Limits for tasks assessed    Extremity/Trunk Assessment Right Upper Extremity Assessment RUE ROM/Strength/Tone: [facility] for tasks assessed Left Upper Extremity Assessment LUE ROM/Strength/Tone: WFL for tasks assessed Right Lower Extremity Assessment RLE ROM/Strength/Tone: Due to pain;Due to precautions;Deficits Left Lower Extremity Assessment LLE ROM/Strength/Tone: WFL for tasks assessed Trunk Assessment Trunk Assessment: Normal     Mobility Bed Mobility Bed Mobility: Supine to Sit Supine to Sit: 4: Min assist Sitting - Scoot to Edge of Bed: 4: Min assist Sit to Supine: 4: Min assist;HOB flat;With rail Details for Bed Mobility Assistance: assistance for R LE; c/o increased pain; vc's to maintain hip precautions  Transfers Sit to Stand: 4: Min assist;From bed Stand to Sit: 4: Min guard;To bed Details for Transfer Assistance: vc's for hand placment and safety with RW      Exercise Total Joint Exercises Ankle Circles/Pumps: AROM;Both;10 reps;Supine Quad Sets: AROM;10 reps;Right;Supine Heel Slides: AAROM;Right;10 reps;Supine Hip ABduction/ADduction: AAROM;Right;10 reps;Supine (c/o increased pain)   Balance Balance Balance Assessed: No   End of Session OT - End of Session Equipment Utilized During Treatment: Gait  belt Activity Tolerance: Patient tolerated treatment well Patient left: in bed;with call bell/phone within reach Nurse Communication: Mobility status  GO     Derrica Sieg,HILLARY 07/02/2012, 5:33 PM [facility], OTR/L  [phone] 07/02/2012

## 2012-07-02 NOTE — Op Note (Signed)
NAME:  [name], [name] NO.:  [id number]  MEDICAL RECORD NO.:  [id number]  LOCATION:  [id number]                        FACILITY:  [facility]  PHYSICIAN:  Dyke Brackett, M.D.    DATE OF BIRTH:  November 14, 1960  DATE OF PROCEDURE: DATE OF DISCHARGE:                              OPERATIVE REPORT   INDICATION:  A 52 year old, avascular necrosis, collapsed femoral head, amenable to right total hip replacement.  PREOPERATIVE DIAGNOSIS:  Avascular necrosis, right hip.  POSTOPERATIVE DIAGNOSIS:  Avascular necrosis, right hip.  OPERATION:  Right total hip replacement (AML 13.5 mm large stature stem, +5 mm neck length 32 mm hip ball with Gription cup sector marathon poly 50 mm diameter).  SURGEON:  Dyke Brackett, MD  ASSISTANT:  Margart Sickles, PA-C  BLOOD LOSS:  Approximately 400.  DESCRIPTION OF PROCEDURE:  Sterile prep and drape, lateral position, posterior approach to hip made, splitting the iliotibial band with short external rotators of the hip.  We made a T capsulotomy, dislocated the hip, cut the femoral head with avascular necrosis, noted quick collapse of the head about 1 fingerbreadth above the lesser trochanter, progressively reamed and then broached to accept a 13.5 mm broach.  Attention was next directed to acetabulum. Acetabulum retractors were placed anteriorly and posteriorly.  Fiber fatty tissue was removed from acetabulum.  Wing retractor was used initially to expose the acetabulum. We reamed the acetabulum up to 40 mm to accept the 50 mm cup for 1 mm under reaming with about 45-50 degrees abduction, 20 degrees anteversion.  Trial cup was sized below, bottomed out.  We then placed the final cup and sector Gription cup with excellent stability.  Trial poly was placed in this.  We then trialed off the last rasp and settled on a +5 mm, 32 mm hip ball.  The trial poly and rasp was removed.  We then placed the final femoral prosthesis.  Appropriate amount of  anteversion followed by the final cup liner with the buildup on the cup at about 10 o'clock positioning.  All parameters were tested.  We settled on a +5 mm neck length.  She really would only dislocate with greater than 90 degrees flexion, full adduction, internal rotation +75 to 80 degrees.  Wound was copiously irrigated.  Final head was inserted.  Closure was affected on the capsule with #1 Tycron iliotibial band with #1 Vicryl in the subcutaneous tissues with 2-0 Vicryl and skin clips.  Marcaine with epinephrine was instilled into the skin, lightly pressure sterile dressing, knee immobilizer applied, taken to recovery room in stable condition.     Dyke Brackett, M.D.     WDC/MEDQ  D:  07/01/2012  T:  07/02/2012  Job:  [id number]

## 2012-07-02 NOTE — Progress Notes (Signed)
ANTICOAGULATION CONSULT NOTE - Initial Consult  Pharmacy Consult for Coumadin Indication: VTE prophylaxis  No Known Allergies  Vital Signs: BP: 90/64 mmHg (05/10 0846)  Labs:  Recent Labs  07/02/12 0535  HGB 9.8*  HCT 28.6*  PLT 115*  LABPROT 15.9*  INR 1.30  CREATININE 0.71    The CrCl is unknown because both a height and weight (above a minimum accepted value) are required for this calculation.   Medical History: Past Medical History  Diagnosis Date  . Asthma   . Bronchitis   . Hypertension   . GERD (gastroesophageal reflux disease)   . Chronic back pain   . Shortness of breath   . Headache   . Anxiety   . COPD (chronic obstructive pulmonary disease)     O2 2 liters at bedtime  . Sleep apnea     stops breathing at night sometimes, uses o2 at 2L HS  . Seizures     hx 1 seizure 2000, none since  . Sciatic nerve pain   . Arthritis     Medications:  Scheduled:  . sodium chloride   Intravenous Once  . [COMPLETED]  ceFAZolin (ANCEF) IV  1 g Intravenous Q6H  . clonazePAM  0.5 mg Oral TID  . cyclobenzaprine  10 mg Oral TID  . docusate sodium  100 mg Oral BID  . [EXPIRED] HYDROmorphone      . [EXPIRED] HYDROmorphone      . lisinopril  20 mg Oral Daily  . mometasone-formoterol  2 puff Inhalation BID  . pantoprazole  40 mg Oral Q1200  . tiotropium  18 mcg Inhalation Daily  . [COMPLETED] warfarin  7.5 mg Oral ONCE-1800  . Warfarin - Pharmacist Dosing Inpatient   Does not apply q1800  . [DISCONTINUED] acetaminophen  1,000 mg Intravenous Once  . [DISCONTINUED] albuterol  2.5 mg Nebulization Q6H  . [DISCONTINUED] chlorhexidine  60 mL Topical Once  . [DISCONTINUED] chlorhexidine  60 mL Topical Once    Assessment: 52 yr old female on coumadin for VTE prophylaxis s/p THA.  INR=1.30, subtherapeutic but increasing after one dose of coumadin given.  Plt count of 115 noted.    Goal of Therapy:  INR 2-3 Monitor platelets by anticoagulation protocol: Yes   Plan:   Repeat Coumadin 7.5mg  po x 1 dose tonight. Daily PT/INR.   Wendie Simmer, PharmD, BCPS Clinical Pharmacist  Pager: [phone]

## 2012-07-02 NOTE — Evaluation (Signed)
Physical Therapy Evaluation Patient Details Name: [name] MRN: [id number] DOB: 08-26-1960 Today's Date: 07/02/2012 Time: 7829-5621 PT Time Calculation (min): 32 min  PT Assessment / Plan / Recommendation Clinical Impression  Pt is a 52 y.o. female s/p R THA POD#1. Pt limited in therapy and ambulation this morning due to increased fatigue. Pt presents with decreased mobility, decreased independence with transfers and decreased ROM/Strength in R hip. Pt to benefit from skilled PT to maximize functional mobility.     PT Assessment  Patient needs continued PT services    Follow Up Recommendations  Home health PT;Supervision/Assistance - 24 hour    Does the patient have the potential to tolerate intense rehabilitation      Barriers to Discharge        Equipment Recommendations  None recommended by PT    Recommendations for Other Services OT consult   Frequency 7X/week    Precautions / Restrictions Precautions Precautions: Posterior Hip;Fall Precaution Booklet Issued: Yes (comment) Precaution Comments: pt aware of hip precautions due to previous hip surgery; able to independently recall 2/3 precautions; given handout and educated  Required Braces or Orthoses: Knee Immobilizer - Right Knee Immobilizer - Right: Other (comment) (until d/c by MD ) Restrictions Weight Bearing Restrictions: Yes RLE Weight Bearing: Weight bearing as tolerated   Pertinent Vitals/Pain Pain 2/10 in R hip; increases with activity. Pt premedicated. BP 72/51 at end of session; pt sitting in chair; RN notified.       Mobility  Bed Mobility Bed Mobility: Supine to Sit;Sitting - Scoot to Edge of Bed Supine to Sit: 4: Min assist;HOB elevated;With rails Sitting - Scoot to Edge of Bed: 4: Min assist Details for Bed Mobility Assistance: assistance to advance R LE due to pain; requries vc's for hand placement and to adhere to hip precautions  Transfers Transfers: Sit to Stand;Stand to Sit Sit to Stand:  4: Min assist;From bed;From elevated surface;With upper extremity assist Stand to Sit: 4: Min guard;With armrests;With upper extremity assist;To chair/3-in-1 Details for Transfer Assistance: vc's for hand placement, RW safety and hip precautions; encouraged to control desecent to chair with UEs.  Ambulation/Gait Ambulation/Gait Assistance: 4: Min assist Ambulation Distance (Feet): 6 Feet Assistive device: Rolling walker Ambulation/Gait Assistance Details: pt stated " i just feel weak"; denies dizziniess. required vc's for gt sequencing; and assistance to advance the RW at times due to increased pain Gait Pattern: Step-to pattern;Decreased step length - left;Decreased stance time - right;Decreased stride length;Trunk flexed;Antalgic Gait velocity: decreased General Gait Details: pt states she does not always use O2; will use 2L O2 as needed; amb on RA; O2 stat at 94% at end of amb  Stairs: No Wheelchair Mobility Wheelchair Mobility: No    Exercises Total Joint Exercises Ankle Circles/Pumps: AROM;Both;10 reps;Supine Quad Sets: AROM;10 reps;Right;Supine Gluteal Sets: AROM;Both;10 reps;Supine   PT Diagnosis: Difficulty walking;Acute pain  PT Problem List: Decreased strength;Decreased range of motion;Decreased activity tolerance;Decreased balance;Decreased mobility;Decreased knowledge of use of DME;Pain;Decreased knowledge of precautions PT Treatment Interventions: DME instruction;Gait training;Functional mobility training;Therapeutic activities;Therapeutic exercise;Balance training;Neuromuscular re-education;Patient/family education   PT Goals Acute Rehab PT Goals PT Goal Formulation: With patient Time For Goal Achievement: 07/06/12 Potential to Achieve Goals: Good Pt will go Supine/Side to Sit: with modified independence PT Goal: Supine/Side to Sit - Progress: Goal set today Pt will go Sit to Supine/Side: with modified independence PT Goal: Sit to Supine/Side - Progress: Goal set  today Pt will go Sit to Stand: with modified independence PT Goal: Sit to Stand -  Progress: Goal set today Pt will go Stand to Sit: with modified independence PT Goal: Stand to Sit - Progress: Goal set today Pt will Ambulate: >150 feet;with supervision;with rolling walker PT Goal: Ambulate - Progress: Goal set today Pt will Perform Home Exercise Program: with supervision, verbal cues required/provided PT Goal: Perform Home Exercise Program - Progress: Goal set today Additional Goals Additional Goal #1: pt will be able to independently recall 3/3 hip precautions.  PT Goal: Additional Goal #1 - Progress: Goal set today  Visit Information  Last PT Received On: 07/02/12 Assistance Needed: +1    Subjective Data  Subjective: "I have been here before for the left hip. I havent slept that good in awhile." Agreeable to therapy Patient Stated Goal: home with family/friends    Prior Functioning  Home Living Lives With: Family Available Help at Discharge: Family;Friend(s);Available 24 hours/day Type of Home: Mobile home Home Access: Ramped entrance Home Layout: One level Bathroom Shower/Tub: Engineer, manufacturing systems:  (uses 3 in 1) Bathroom Accessibility: Yes How Accessible: Accessible via walker Home Adaptive Equipment: Bedside commode/3-in-1;Walker - rolling;Shower chair with back Additional Comments: Pt had L THA last year  Prior Function Level of Independence: Independent with assistive device(s) Able to Take Stairs?: Yes Driving: Yes Vocation: On disability Communication Communication: No difficulties Dominant Hand: Right    Cognition  Cognition Arousal/Alertness: Awake/alert Behavior During Therapy: WFL for tasks assessed/performed Overall Cognitive Status: Within Functional Limits for tasks assessed    Extremity/Trunk Assessment Right Lower Extremity Assessment RLE ROM/Strength/Tone: Unable to fully assess;Deficits;Due to pain RLE ROM/Strength/Tone Deficits: DF/PF  5/5 RLE Sensation: WFL - Light Touch Left Lower Extremity Assessment LLE ROM/Strength/Tone: Within functional levels LLE Sensation: WFL - Light Touch Trunk Assessment Trunk Assessment: Normal   Balance Balance Balance Assessed: No  End of Session PT - End of Session Equipment Utilized During Treatment: Gait belt;Right knee immobilizer Activity Tolerance: Patient limited by fatigue Patient left: in chair;with call bell/phone within reach;with nursing in room Nurse Communication: Mobility status;Other (comment) (BP 72/51 at end of session; pt nauseous; denies dizziness )  GP     Donell Sievert, [place] [phone] 07/02/2012, 8:44 AM

## 2012-07-02 NOTE — Progress Notes (Signed)
[facility] Progress Note     1 Day Post-Op  Subjective   Doing ok Pain improving Not hungry No CP or SOB No lightheadedness  Pt sitting in bedside chair  Objective  BP 157/83  Pulse 90  Temp(Src) 98.7 F (37.1 C) (Oral)  Resp 18  SpO2 100%  Intake/Output     05/09 0701 - 05/10 0700 05/10 0701 - 05/11 0700   I.V. 2760    Blood 195    IV Piggyback 250    Total Intake 3205     Urine 650    Blood 600    Total Output 1250     Net +1955            Labs Results for LIS, SAVITT (MRN [id number]) as of 07/02/2012 08:41  Ref. Range 07/02/2012 05:35  Sodium Latest Range: 135-145 mEq/L 135  Potassium Latest Range: 3.5-5.1 mEq/L 3.9  Chloride Latest Range: 96-112 mEq/L 101  CO2 Latest Range: 19-32 mEq/L 27  BUN Latest Range: 6-23 mg/dL 8  Creatinine Latest Range: 0.50-1.10 mg/dL 4.09  Calcium Latest Range: 8.4-10.5 mg/dL 8.1 (L)  GFR calc non Af Amer Latest Range: >90 mL/min >90  GFR calc Af Amer Latest Range: >90 mL/min >90  Glucose Latest Range: 70-99 mg/dL 811 (H)  WBC Latest Range: 4.0-10.5 K/uL 8.9  RBC Latest Range: 3.87-5.11 MIL/uL 3.28 (L)  Hemoglobin Latest Range: 12.0-15.0 g/dL 9.8 (L)  HCT Latest Range: 36.0-46.0 % 28.6 (L)  MCV Latest Range: 78.0-100.0 fL 87.2  MCH Latest Range: 26.0-34.0 pg 29.9  MCHC Latest Range: 30.0-36.0 g/dL 91.4  RDW Latest Range: 11.5-15.5 % 15.8 (H)  Platelets Latest Range: 150-400 K/uL 115 (L)  Prothrombin Time Latest Range: 11.6-15.2 seconds 15.9 (H)  INR Latest Range: 0.00-1.49  1.30    Exam  Gen: appears tired, sitting in bedside chair, answers questions appropriately Lungs: clear anterior fields Cardiac: s1 and s2, reg Abd: + BS, NT Ext:            Right Lower Extremity    Dressing c/d/i  Motor and sensory functions intact  Ext warm  + DP pulse  No dct   Assessment and Plan  1 Day Post-Op  52 y/o female s/p R THA for AVN  1. R THA POD 1  WBAT  Total hip precautions  Ice     PT/OT  Dressing change tomorrow  2. Thrombocytopenia  1 unit of platelets given pre-op  plt count today is good  Monitor  3. COPD  Home o2 at bed time 2 L  Continue home meds  4. Pain management:  Transition to PO meds  5. DVT/PE prophylaxis:  Coumadin  scd's 6. Activity:  WBAT  Total hip precautions  PT/OT  7. FEN/Foley/Lines:  Dc foley  Maintain IVF at current rate given low PO intake  8. Dispo:  PT/OT  Suspect pt will progress slowly  Likely d/c home monday   Mearl Latin, PA-C [facility] [phone] (P) 07/02/2012 8:40 AM    Addendum  Called by RN around 0900, PT informed RN that pts BPs dipepd quite low with therapy, repeat check by RN showed BP of 90/64, HR 102 and regular. Pt remains asymptomatic , no CP or SOB, no lightheadedness but we are only POD 1.  Recheck CBC now NS bolus 500cc x 1  Hold BP meds and benzos Continue to monitor  Mearl Latin, PA-C [facility] [phone] (P) 07/02/2012 9:40 AM

## 2012-07-03 ENCOUNTER — Encounter (HOSPITAL_COMMUNITY): Payer: Self-pay | Admitting: *Deleted

## 2012-07-03 DIAGNOSIS — N390 Urinary tract infection, site not specified: Secondary | ICD-10-CM | POA: Diagnosis present

## 2012-07-03 LAB — CBC
HCT: 26.7 % — ABNORMAL LOW (ref 36.0–46.0)
Hemoglobin: 9.3 g/dL — ABNORMAL LOW (ref 12.0–15.0)
MCV: 88.1 fL (ref 78.0–100.0)
RBC: 3.03 MIL/uL — ABNORMAL LOW (ref 3.87–5.11)
RDW: 16.1 % — ABNORMAL HIGH (ref 11.5–15.5)
WBC: 6.6 10*3/uL (ref 4.0–10.5)

## 2012-07-03 MED ORDER — LEVOFLOXACIN 250 MG PO TABS
250.0000 mg | ORAL_TABLET | Freq: Every day | ORAL | Status: DC
  Administered 2012-07-03 – 2012-07-04 (×2): 250 mg via ORAL
  Filled 2012-07-03 (×2): qty 1

## 2012-07-03 NOTE — Progress Notes (Signed)
Physical Therapy Treatment Patient Details Name: [name] MRN: [id number] DOB: [birth year] Today's Date: 07/03/2012 Time: 4098-1191 PT Time Calculation (min): 23 min  PT Assessment / Plan / Recommendation Comments on Treatment Session  Pt limited mobility today due to 10/10 pain and fatigue. pt very drowsy this afternoon. C/o "heart racing" after activity; BP 134/60 HR 105 and O2 at 95%. Anticipate pt D/C tomorrow with family. Will cont f/u with pt to increase mobility and indpendence with gt/transfers.    Follow Up Recommendations  Home health PT;Supervision/Assistance - 24 hour     Does the patient have the potential to tolerate intense rehabilitation     Barriers to Discharge        Equipment Recommendations  None recommended by PT    Recommendations for Other Services    Frequency 7X/week   Plan Discharge plan remains appropriate;Frequency remains appropriate    Precautions / Restrictions Precautions Precautions: Posterior Hip;Fall Precaution Comments: pt recalled 2/3 hip precautions  Restrictions Weight Bearing Restrictions: Yes RLE Weight Bearing: Weight bearing as tolerated   Pertinent Vitals/Pain 8/10 at end of session; BP 134/60, O2 95% HR 105 after amb. Pt premedicated.      Mobility  Bed Mobility Bed Mobility: Supine to Sit;Sitting - Scoot to Edge of Bed Supine to Sit: 4: Min assist;HOB elevated Sitting - Scoot to Edge of Bed: 4: Min guard Sit to Supine: 4: Min assist;HOB flat;With rail Details for Bed Mobility Assistance: (A) for R LE; c/o increased pain Transfers Transfers: Sit to Stand;Stand to Sit Sit to Stand: From elevated surface;4: Min guard;From bed Stand to Sit: 5: Supervision;To bed Details for Transfer Assistance: vc's for hip precaution safety Ambulation/Gait Ambulation/Gait Assistance: 4: Min guard Ambulation Distance (Feet): 20 Feet Assistive device: Rolling walker Ambulation/Gait Assistance Details: limited by pain and fatigue; unable  to increase amb distance; amb with decreased heel strike on R LE and on toes due to inability to WB without pain on R LE; vc's for gt sequencing and proper gt Gait Pattern: Step-to pattern;Decreased step length - left;Decreased stance time - right;Decreased stride length;Trunk flexed;Antalgic Gait velocity: decreased Stairs: No Wheelchair Mobility Wheelchair Mobility: No    Exercises Total Joint Exercises Ankle Circles/Pumps: AROM;15 reps;Both Quad Sets: AROM;Right;10 reps Hip ABduction/ADduction: AAROM;Right;10 reps;Supine Long Arc Quad: AROM;Right;10 reps;Seated   PT Diagnosis:    PT Problem List:   PT Treatment Interventions:     PT Goals Acute Rehab PT Goals PT Goal Formulation: With patient Time For Goal Achievement: 07/06/12 Potential to Achieve Goals: Good PT Goal: Supine/Side to Sit - Progress: Progressing toward goal PT Goal: Sit to Supine/Side - Progress: Progressing toward goal PT Goal: Sit to Stand - Progress: Progressing toward goal PT Goal: Stand to Sit - Progress: Progressing toward goal PT Goal: Ambulate - Progress: Progressing toward goal PT Goal: Perform Home Exercise Program - Progress: Progressing toward goal Additional Goals PT Goal: Additional Goal #1 - Progress: Progressing toward goal  Visit Information  Last PT Received On: 07/03/12 Assistance Needed: +1    Subjective Data  Subjective: "still been sleeping all day. I dont remember sleeping this much after my last surgery"  Patient Stated Goal: home tomorrow    Cognition  Cognition Arousal/Alertness: Suspect due to medications (drowsy) Behavior During Therapy: WFL for tasks assessed/performed Overall Cognitive Status: Within Functional Limits for tasks assessed    Balance  Balance Balance Assessed: No  End of Session PT - End of Session Equipment Utilized During Treatment: Gait belt Activity Tolerance: Patient  limited by fatigue;Patient limited by pain Patient left: in bed;with call  bell/phone within reach;with family/visitor present Nurse Communication: Mobility status   GP     Donell Sievert,  [phone] 07/03/2012, 2:28 PM

## 2012-07-03 NOTE — Progress Notes (Signed)
[facility] Progress Note     2 Days Post-Op  Subjective   Doing better today Pain controlled No CP or SOB Tolerated 1 unit of PRBC's yesterday  F/u cbc late morning demonstrated moderate drop in H/H in setting of tachycardia and low BP  No new issues this am Working with therapy Not hungry  Minimal flatus No BM   Objective  BP 116/57  Pulse 96  Temp(Src) 97.8 F (36.6 C) (Oral)  Resp 18  SpO2 94%  Intake/Output     05/10 0701 - 05/11 0700 05/11 0701 - 05/12 0700   I.V. 250    Blood 362.5    IV Piggyback     Total Intake 612.5     Urine     Blood     Total Output       Net +612.5          Urine Occurrence 4 x      Labs Results for MARIAHA, ELLINGTON (MRN [id number]) as of 07/03/2012 09:32  Ref. Range 07/03/2012 05:29  WBC Latest Range: 4.0-10.5 K/uL 6.6  RBC Latest Range: 3.87-5.11 MIL/uL 3.03 (L)  Hemoglobin Latest Range: 12.0-15.0 g/dL 9.3 (L)  HCT Latest Range: 36.0-46.0 % 26.7 (L)  MCV Latest Range: 78.0-100.0 fL 88.1  MCH Latest Range: 26.0-34.0 pg 30.7  MCHC Latest Range: 30.0-36.0 g/dL 40.9  RDW Latest Range: 11.5-15.5 % 16.1 (H)  Platelets Latest Range: 150-400 K/uL 78 (L)  Prothrombin Time Latest Range: 11.6-15.2 seconds 31.7 (H)  INR Latest Range: 0.00-1.49  3.30 (H)    Exam  Gen: awake and alert, sitting in bedside chair, working with therapy Lungs: clear anteriorly  Cardiac: reg, s1 and s2 Abd: + BS, NT Ext:            Right Lower Extremity    Incision looks great  No bleeding  Distal motor and sensory functions intact  + DP pulse  Swelling controlled  No DCT    Assessment and Plan  2 Days Post-Op 52 y/o female s/p R THA for AVN  1. R THA POD 1             WBAT             Total hip precautions             Ice                    PT/OT             Dressing change tomorrow  2. Thrombocytopenia             1 unit of platelets given pre-op             plt count today is good             Monitor  3. COPD          Home o2 at bed time 2 L             Continue home meds  4. Pain management:             Transition to PO meds  5. ABL anemia  Pt was symptomatic yesterday with tachycardia and hypotension   Transfused 1 Unit prbc's  Pt feels much better  Continue to monitor  6. Thrombocytopenia  This appears to be a chronic issue for pt   No active bleeding noted  No transfusion at this time     7.  DVT/PE prophylaxis:             Coumadin, INR >3 today  Continue per pharmacy              scd's  8. UTI  This was noted pre-op  pts states she has only taken 1 dose of abx before hospital  Will restart pt on levaquin 250 mg po daily x 3 days  Voiding w/o problems  9. Activity:             WBAT             Total hip precautions             PT/OT  10. FEN/Foley/Lines:             Dc foley             KVO IVF   11. Dispo:             PT/OT             Likely d/c home tomorrow   Mearl Latin, PA-C [facility] [phone] (P) 07/03/2012 9:29 AM

## 2012-07-03 NOTE — Progress Notes (Signed)
ANTICOAGULATION CONSULT NOTE - Initial Consult  Pharmacy Consult for Coumadin Indication: VTE prophylaxis  No Known Allergies  Vital Signs: Temp: 97.8 F (36.6 C) (05/11 0520) BP: 98/64 mmHg (05/11 1000) Pulse Rate: 96 (05/11 0520)  Labs:  Recent Labs  07/02/12 0535 07/02/12 1044 07/03/12 0529  HGB 9.8* 8.9* 9.3*  HCT 28.6* 25.9* 26.7*  PLT 115* 103* 78*  LABPROT 15.9*  --  31.7*  INR 1.30  --  3.30*  CREATININE 0.71  --   --     Estimated Creatinine Clearance: 76.5 ml/min (by C-G formula based on Cr of 0.71).   Medical History: Past Medical History  Diagnosis Date  . Asthma   . Bronchitis   . Hypertension   . GERD (gastroesophageal reflux disease)   . Chronic back pain   . Shortness of breath   . Headache   . Anxiety   . COPD (chronic obstructive pulmonary disease)     O2 2 liters at bedtime  . Sleep apnea     stops breathing at night sometimes, uses o2 at 2L HS  . Seizures     hx 1 seizure 2000, none since  . Sciatic nerve pain   . Arthritis     Medications:  Scheduled:  . sodium chloride   Intravenous Once  . [COMPLETED] acetaminophen  650 mg Oral Once  . clonazePAM  0.5 mg Oral TID  . cyclobenzaprine  10 mg Oral TID  . [COMPLETED] diphenhydrAMINE  25 mg Oral Once  . docusate sodium  100 mg Oral BID  . [COMPLETED] furosemide  20 mg Intravenous Once  . levofloxacin  250 mg Oral Daily  . lisinopril  20 mg Oral Daily  . mometasone-formoterol  2 puff Inhalation BID  . pantoprazole  40 mg Oral Q1200  . tiotropium  18 mcg Inhalation Daily  . [COMPLETED] warfarin  7.5 mg Oral ONCE-1800  . Warfarin - Pharmacist Dosing Inpatient   Does not apply q1800    Assessment: 52 yr old female on coumadin for VTE prophylaxis s/p THA.  INR up to 3.30, supratherapeutic after 2 doses of 7.5mg .  Will hold dose today.  Patient received 1 unit PRBCs yesterday bc of drop in H/H and low BP.  Plt=78.    Goal of Therapy:  INR 2-3 Monitor platelets by anticoagulation  protocol: Yes   Plan:  Hold coumadin tonight.  Daily PT/INR.  Wendie Simmer, PharmD, BCPS Clinical Pharmacist  Pager: [phone]

## 2012-07-03 NOTE — Progress Notes (Signed)
Physical Therapy Treatment Patient Details Name: [name] MRN: [id number] DOB: 02/28/1960 Today's Date: 07/03/2012 Time: 4098-1191 PT Time Calculation (min): 29 min  PT Assessment / Plan / Recommendation Comments on Treatment Session  Pt progressing slowly. Limited ambultation today due to fatigue in R LE. Anticipate D/C home tomorrow. Will cont to f/u with pt to increase mobility.    Follow Up Recommendations  Home health PT;Supervision/Assistance - 24 hour     Does the patient have the potential to tolerate intense rehabilitation     Barriers to Discharge        Equipment Recommendations  None recommended by PT    Recommendations for Other Services    Frequency 7X/week   Plan Discharge plan remains appropriate;Frequency remains appropriate    Precautions / Restrictions Precautions Precautions: Posterior Hip;Fall Precaution Booklet Issued: Yes (comment) Precaution Comments: pt able to independently recall 2/3 hip precautions Required Braces or Orthoses: Knee Immobilizer - Right Knee Immobilizer - Right: Other (comment) (until d/c by MD ) Restrictions Weight Bearing Restrictions: Yes RLE Weight Bearing: Weight bearing as tolerated   Pertinent Vitals/Pain 8/10 with activity; premedicated and repositioned in chair.     Mobility  Bed Mobility Bed Mobility: Not assessed (pt sitting in chair) Transfers Transfers: Sit to Stand;Stand to Sit Sit to Stand: 4: Min assist;From chair/3-in-1;With armrests Stand to Sit: 4: Min guard;To chair/3-in-1 Details for Transfer Assistance: vc's for safety with transfers to adhere to hip precautions; pt requires assistance to stand from chair due to lower surface Ambulation/Gait Ambulation/Gait Assistance: 4: Min guard Ambulation Distance (Feet): 40 Feet Assistive device: Rolling walker Ambulation/Gait Assistance Details: cues for gt sequencing; encouraged pt to WB through R LE; pt tends to amb on toes of R LE to decreased WB and  reduce pain; amb without immobilizer and no buckling noted; pt fatigued quickly and required 2 standings rest breaks; with fatigue; pt step length on L LE is decreased. Gait Pattern: Step-to pattern;Decreased step length - left;Decreased stance time - right;Decreased stride length;Trunk flexed;Antalgic Gait velocity: decreased Stairs: No Wheelchair Mobility Wheelchair Mobility: No    Exercises Total Joint Exercises Ankle Circles/Pumps: AROM;Both;10 reps;Seated Quad Sets: AROM;10 reps;Seated Towel Squeeze: AROM;Both;10 reps;Seated Long Arc Quad: AROM;Right;10 reps;Seated   PT Diagnosis:    PT Problem List:   PT Treatment Interventions:     PT Goals Acute Rehab PT Goals PT Goal Formulation: With patient Time For Goal Achievement: 07/06/12 Potential to Achieve Goals: Good PT Goal: Sit to Stand - Progress: Progressing toward goal PT Goal: Stand to Sit - Progress: Progressing toward goal PT Goal: Ambulate - Progress: Progressing toward goal PT Goal: Perform Home Exercise Program - Progress: Progressing toward goal  Visit Information  Last PT Received On: 07/03/12 Assistance Needed: +1    Subjective Data  Subjective: "I slept for 48 hours. The pain comes and it goes" Patient Stated Goal: home tomorrow    Cognition  Cognition Arousal/Alertness: Suspect due to medications (drowsy) Behavior During Therapy: WFL for tasks assessed/performed Overall Cognitive Status: Within Functional Limits for tasks assessed    Balance  Balance Balance Assessed: No  End of Session PT - End of Session Equipment Utilized During Treatment: Gait belt Activity Tolerance: Patient limited by fatigue Patient left: in chair;with call bell/phone within reach;with family/visitor present Nurse Communication: Mobility status   GP     Donell Sievert, [place] [phone] 07/03/2012, 9:56 AM

## 2012-07-03 NOTE — Progress Notes (Signed)
Occupational Therapy Treatment Patient Details Name: [name] MRN: [id number] DOB: 22-Dec-1960 Today's Date: 07/03/2012 Time: 1202-1230 OT Time Calculation (min): 28 min  OT Assessment / Plan / Recommendation Comments on Treatment Session Pt making good progress. Issued AE today which significantly increases her independence with ADL. Will work on Radio producer. Will continue to follow.    Follow Up Recommendations  No OT follow up    Barriers to Discharge   none    Equipment Recommendations  None recommended by OT    Recommendations for Other Services  none  Frequency Min 2X/week   Plan Discharge plan remains appropriate    Precautions / Restrictions Precautions Precautions: Posterior Hip;Fall Precaution Booklet Issued: Yes (comment) Precaution Comments: recalled 3/3 precautions Required Braces or Orthoses: Knee Immobilizer - Right Knee Immobilizer - Right: Other (comment) (until d/c by MD ) Restrictions Weight Bearing Restrictions: Yes RLE Weight Bearing: Weight bearing as tolerated   Pertinent Vitals/Pain Request for pain meds. Ice applied R hip    ADL  Lower Body Bathing: Simulated;Minimal assistance;Other (comment) (with AE) Lower Body Dressing: Set up;Supervision/safety;Other (comment) (with AE) Where Assessed - Lower Body Dressing: Unsupported sit to stand Toilet Transfer: Supervision/safety Toilet Transfer Method: Sit to Barista: Bedside commode Equipment Used: Gait belt;Rolling walker;Sock aid;Reacher;Long-handled sponge Transfers/Ambulation Related to ADLs: S ADL Comments: Requires occasional vc to follow hip precautions. AE significantly increases independence with ADL    OT Diagnosis:    OT Problem List:   OT Treatment Interventions:     OT Goals Acute Rehab OT Goals OT Goal Formulation: With patient Time For Goal Achievement: 07/16/12 Potential to Achieve Goals: Good ADL Goals Pt Will Perform Lower Body  Bathing: with set-up;with supervision;with caregiver independent in assisting;Sit to stand from chair;with adaptive equipment;with cueing (comment type and amount) ADL Goal: Lower Body Bathing - Progress: Met Pt Will Perform Lower Body Dressing: with set-up;with supervision;Sit to stand from chair;with adaptive equipment;with cueing (comment type and amount) ADL Goal: Lower Body Dressing - Progress: Met Pt Will Transfer to Toilet: with modified independence;with DME;3-in-1;Maintaining hip precautions ADL Goal: Toilet Transfer - Progress: Progressing toward goals Pt Will Perform Tub/Shower Transfer: Tub transfer;with min assist;with caregiver independent in assisting;Ambulation;with DME;Transfer tub bench;Maintaining hip precautions ADL Goal: Tub/Shower Transfer - Progress: Goal set today Additional ADL Goal #1: Pt will demonstrate 3/3 hip precautions during ADL task. ADL Goal: Additional Goal #1 - Progress: Progressing toward goals  Visit Information  Last OT Received On: 07/03/12 Assistance Needed: +1    Subjective Data      Prior Functioning       Cognition  Cognition Arousal/Alertness: Awake/alert Behavior During Therapy: WFL for tasks assessed/performed Overall Cognitive Status: Within Functional Limits for tasks assessed    Mobility  Bed Mobility Bed Mobility: Supine to Sit;Sitting - Scoot to Edge of Bed Supine to Sit: 5: Supervision Sitting - Scoot to Edge of Bed: 6: Modified independent (Device/Increase time) Transfers Transfers: Sit to Stand;Stand to Sit Sit to Stand: 5: Supervision;With upper extremity assist;From bed Stand to Sit: 5: Supervision;With upper extremity assist;To chair/3-in-1 Details for Transfer Assistance: good adherence to precautions during transfer    Exercises  Total Joint Exercises Ankle Circles/Pumps: AROM;Both;10 reps;Seated Quad Sets: AROM;10 reps;Seated Towel Squeeze: AROM;Both;10 reps;Seated Long Arc Quad: AROM;Right;10 reps;Seated    Balance Balance Balance Assessed: No   End of Session OT - End of Session Equipment Utilized During Treatment: Gait belt Activity Tolerance: Patient tolerated treatment well Patient left: in chair;with call  bell/phone within reach;with family/visitor present Nurse Communication: Patient requests pain meds  GO     Chavie Kolinski,HILLARY 07/03/2012, 12:39 PM [facility], OTR/L  [phone] 07/03/2012

## 2012-07-04 ENCOUNTER — Encounter (HOSPITAL_COMMUNITY): Payer: Self-pay | Admitting: Orthopedic Surgery

## 2012-07-04 LAB — TYPE AND SCREEN: ABO/RH(D): A POS

## 2012-07-04 LAB — PROTIME-INR: INR: 4.15 — ABNORMAL HIGH (ref 0.00–1.49)

## 2012-07-04 LAB — CBC
HCT: 27.5 % — ABNORMAL LOW (ref 36.0–46.0)
Hemoglobin: 9.4 g/dL — ABNORMAL LOW (ref 12.0–15.0)
RBC: 3.08 MIL/uL — ABNORMAL LOW (ref 3.87–5.11)
WBC: 5.6 10*3/uL (ref 4.0–10.5)

## 2012-07-04 MED ORDER — ASPIRIN EC 325 MG PO TBEC: 325.0000 mg | DELAYED_RELEASE_TABLET | Freq: Two times a day (BID) | ORAL | Status: AC

## 2012-07-04 MED ORDER — WARFARIN SODIUM 2.5 MG PO TABS: 2.5000 mg | ORAL_TABLET | Freq: Every day | ORAL | Status: DC

## 2012-07-04 NOTE — Progress Notes (Signed)
Occupational Therapy Treatment Patient Details Name: [name] MRN: [id number] DOB: October 21, 1960 Today's Date: 07/04/2012 Time: 4098-1191 OT Time Calculation (min): 29 min  OT Assessment / Plan / Recommendation Comments on Treatment Session Pt progressing towards goals.  Practiced toilet transfer and tub transfer.     Follow Up Recommendations  No OT follow up    Barriers to Discharge       Equipment Recommendations  None recommended by OT    Recommendations for Other Services    Frequency Min 2X/week   Plan Discharge plan remains appropriate    Precautions / Restrictions Precautions Precautions: Posterior Hip;Fall Precaution Comments: Pt recalled 3/3 hip precautions. Required Braces or Orthoses: Knee Immobilizer - Right Knee Immobilizer - Right: Other (comment) (until d/c by MD) Restrictions Weight Bearing Restrictions: Yes RLE Weight Bearing: Weight bearing as tolerated   Pertinent Vitals/Pain Pain 8/10. Nurse notified for pain meds.     ADL  Grooming: Supervision/safety Where Assessed - Grooming: Supported standing Toilet Transfer: Radiographer, therapeutic Method: Sit to Barista: Raised toilet seat with arms (or 3-in-1 over toilet) Toileting - Clothing Manipulation and Hygiene: Supervision/safety Where Assessed - Engineer, mining and Hygiene: Standing Tub/Shower Transfer: Performed;Min guard Web designer Method: Science writer: Counsellor Used: Gait belt;Rolling walker;Knee Immobilizer ADL Comments: Occasional vc's to maintain hip precautions during session. Pt at minguard level with tub transfer with minimal vc's for hip precautions.     OT Diagnosis:    OT Problem List:   OT Treatment Interventions:     OT Goals Acute Rehab OT Goals OT Goal Formulation: With patient Time For Goal Achievement: 07/16/12 Potential to Achieve Goals: Good ADL Goals Pt Will  Perform Lower Body Bathing: with set-up;with supervision;with caregiver independent in assisting;Sit to stand from chair;with adaptive equipment;with cueing (comment type and amount) ADL Goal: Lower Body Bathing - Progress: Met Pt Will Perform Lower Body Dressing: with set-up;with supervision;Sit to stand from chair;with adaptive equipment;with cueing (comment type and amount) ADL Goal: Lower Body Dressing - Progress: Met Pt Will Transfer to Toilet: with modified independence;with DME;3-in-1;Maintaining hip precautions ADL Goal: Toilet Transfer - Progress: Progressing toward goals Pt Will Perform Tub/Shower Transfer: Tub transfer;with min assist;with caregiver independent in assisting;Ambulation;with DME;Transfer tub bench;Maintaining hip precautions ADL Goal: Tub/Shower Transfer - Progress: Met Additional ADL Goal #1: Pt will demonstrate 3/3 hip precautions during ADL task. ADL Goal: Additional Goal #1 - Progress: Progressing toward goals  Visit Information  Last OT Received On: 07/04/12 Assistance Needed: +1    Subjective Data      Prior Functioning       Cognition  Cognition Arousal/Alertness: Awake/alert Behavior During Therapy: WFL for tasks assessed/performed Overall Cognitive Status: Within Functional Limits for tasks assessed    Mobility  Bed Mobility Bed Mobility: Not assessed Transfers Transfers: Sit to Stand;Stand to Sit Sit to Stand: 5: Supervision;With upper extremity assist;From chair/3-in-1 Stand to Sit: 5: Supervision;With upper extremity assist;To chair/3-in-1 Details for Transfer Assistance: supervision for safety    Exercises      Balance     End of Session OT - End of Session Equipment Utilized During Treatment: Gait belt;Right knee immobilizer (PTA removed knee immobilizer during session) Activity Tolerance: Patient tolerated treatment well Patient left: in chair;Other (comment) (with PT) Nurse Communication: Patient requests pain meds  GO      Earlie Raveling OTR/L [phone] 07/04/2012, 11:49 AM

## 2012-07-04 NOTE — Progress Notes (Signed)
Physical Therapy Treatment Patient Details Name: [name] MRN: [id number] DOB: [birth year] Today's Date: 07/04/2012 Time: 1478-2956 PT Time Calculation (min): 24 min  PT Assessment / Plan / Recommendation Comments on Treatment Session  Patient progressing much better today. Anticipate discharge after second therapy session this afternoon    Follow Up Recommendations  Home health PT;Supervision/Assistance - 24 hour     Does the patient have the potential to tolerate intense rehabilitation     Barriers to Discharge        Equipment Recommendations  None recommended by PT    Recommendations for Other Services    Frequency 7X/week   Plan Discharge plan remains appropriate;Frequency remains appropriate    Precautions / Restrictions Precautions Precautions: Posterior Hip;Fall Precaution Comments: Pt recalled 3/3 hip precautions. Required Braces or Orthoses: Knee Immobilizer - Right Knee Immobilizer - Right: Other (comment) (until d/c by MD) Restrictions Weight Bearing Restrictions: Yes RLE Weight Bearing: Weight bearing as tolerated   Pertinent Vitals/Pain no apparent distress     Mobility  Bed Mobility Bed Mobility: Not assessed Transfers Sit to Stand: 5: Supervision;With upper extremity assist;From chair/3-in-1 Stand to Sit: 5: Supervision;With upper extremity assist;To chair/3-in-1 Details for Transfer Assistance: supervision for safety Ambulation/Gait Ambulation/Gait Assistance: 5: Supervision Ambulation Distance (Feet): 200 Feet Assistive device: Rolling walker Gait Pattern: Step-to pattern Stairs: Yes Stairs Assistance: 4: Min guard Stair Management Technique: Step to pattern;Forwards;With walker Number of Stairs: 5    Exercises Total Joint Exercises Heel Slides: AAROM;Right;10 reps;Supine Hip ABduction/ADduction: AAROM;Right;10 reps;Supine Long Arc Quad: AROM;Left;10 reps   PT Diagnosis:    PT Problem List:   PT Treatment Interventions:     PT  Goals Acute Rehab PT Goals PT Goal: Sit to Stand - Progress: Progressing toward goal PT Goal: Stand to Sit - Progress: Progressing toward goal PT Goal: Ambulate - Progress: Progressing toward goal Pt will Go Up / Down Stairs: 1-2 stairs;with supervision PT Goal: Up/Down Stairs - Progress: Goal set today PT Goal: Perform Home Exercise Program - Progress: Progressing toward goal Additional Goals PT Goal: Additional Goal #1 - Progress: Progressing toward goal  Visit Information  Last PT Received On: 07/04/12 Assistance Needed: +1    Subjective Data      Cognition  Cognition Arousal/Alertness: Awake/alert Behavior During Therapy: WFL for tasks assessed/performed Overall Cognitive Status: Within Functional Limits for tasks assessed    Balance     End of Session PT - End of Session Equipment Utilized During Treatment: Gait belt Activity Tolerance: Patient tolerated treatment well Patient left: in chair;with family/visitor present;with call bell/phone within reach   GP   ~added stair goal.   Fredrich Birks 07/04/2012, 1:00 PM  07/04/2012 Fredrich Birks PTA [phone] pager [phone] office

## 2012-07-04 NOTE — Progress Notes (Signed)
Patient discharged in stable condition via wheelchair. Discharge instructions and prescriptions were given and explained 

## 2012-07-04 NOTE — Progress Notes (Signed)
Physical Therapy Treatment Patient Details Name: [name] MRN: [id number] DOB: March 08, 1960 Today's Date: 07/04/2012 Time: 4098-1191 PT Time Calculation (min): 24 min  PT Assessment / Plan / Recommendation Comments on Treatment Session  Patient progressing much better today. Anticipate discharge after second therapy session this afternoon    Follow Up Recommendations  Home health PT;Supervision/Assistance - 24 hour     Does the patient have the potential to tolerate intense rehabilitation     Barriers to Discharge        Equipment Recommendations  None recommended by PT    Recommendations for Other Services    Frequency 7X/week   Plan Discharge plan remains appropriate;Frequency remains appropriate    Precautions / Restrictions Precautions Precautions: Posterior Hip;Fall Precaution Comments: Pt recalled 3/3 hip precautions. Required Braces or Orthoses: Knee Immobilizer - Right Knee Immobilizer - Right: Other (comment) (until d/c by MD) Restrictions Weight Bearing Restrictions: Yes RLE Weight Bearing: Weight bearing as tolerated   Pertinent Vitals/Pain     Mobility  Bed Mobility Bed Mobility: Not assessed Transfers Sit to Stand: 6: Modified independent (Device/Increase time) Stand to Sit: 6: Modified independent (Device/Increase time) Details for Transfer Assistance: supervision for safety Ambulation/Gait Ambulation/Gait Assistance: 5: Supervision Ambulation Distance (Feet): 200 Feet Assistive device: Rolling walker Ambulation/Gait Assistance Details: Cues to keep R heel strike with ambulation Gait Pattern: Step-to pattern Gait velocity: decreased Stairs: Yes Stairs Assistance: 4: Min guard Stair Management Technique: Step to pattern;Forwards;With walker Number of Stairs: 1    Exercises Total Joint Exercises Heel Slides: AAROM;Right;10 reps;Supine Hip ABduction/ADduction: AAROM;Right;10 reps;Supine Long Arc Quad: AROM;Left;10 reps   PT Diagnosis:    PT  Problem List:   PT Treatment Interventions:     PT Goals Acute Rehab PT Goals PT Goal: Sit to Stand - Progress: Progressing toward goal PT Goal: Stand to Sit - Progress: Progressing toward goal PT Goal: Ambulate - Progress: Progressing toward goal Pt will Go Up / Down Stairs: 1-2 stairs;with supervision PT Goal: Up/Down Stairs - Progress: Goal set today PT Goal: Perform Home Exercise Program - Progress: Progressing toward goal Additional Goals PT Goal: Additional Goal #1 - Progress: Progressing toward goal  Visit Information  Last PT Received On: 07/04/12 Assistance Needed: +1    Subjective Data      Cognition  Cognition Arousal/Alertness: Awake/alert Behavior During Therapy: WFL for tasks assessed/performed Overall Cognitive Status: Within Functional Limits for tasks assessed    Balance     End of Session PT - End of Session Equipment Utilized During Treatment: Gait belt Activity Tolerance: Patient tolerated treatment well Patient left: in chair;with family/visitor present;with call bell/phone within reach   GP     Fredrich Birks 07/04/2012, 2:22 PM 07/04/2012 Fredrich Birks PTA [phone] pager [phone] office

## 2012-07-04 NOTE — Discharge Summary (Signed)
PATIENT ID: [name]        MRN:  [id number]          DOB/AGE: Jul 04, 1960 / 52 y.o.    DISCHARGE SUMMARY  ADMISSION DATE:    07/01/2012 DISCHARGE DATE:   07/04/2012   ADMISSION DIAGNOSIS: Right hip OA    DISCHARGE DIAGNOSIS:  Right hip OA    ADDITIONAL DIAGNOSIS: Active Problems:   COPD   GERD   Avascular necrosis of hip   UTI (urinary tract infection)  Past Medical History  Diagnosis Date  . Asthma   . Bronchitis   . Hypertension   . GERD (gastroesophageal reflux disease)   . Chronic back pain   . Shortness of breath   . Headache   . Anxiety   . COPD (chronic obstructive pulmonary disease)     O2 2 liters at bedtime  . Sleep apnea     stops breathing at night sometimes, uses o2 at 2L HS  . Seizures     hx 1 seizure 2000, none since  . Sciatic nerve pain   . Arthritis     PROCEDURE: Procedure(s): TOTAL HIP ARTHROPLASTY WITH AUTOGRAFT {Right total hip on 07/01/2012  CONSULTS: none  HISTORY: See H&P in chart  HOSPITAL COURSE:  [name] is a 52 y.o. admitted on 07/01/2012 and found to have a diagnosis of Right hip OA.  After appropriate laboratory studies were obtained  they were taken to the operating room on 07/01/2012 and underwent  Procedure(s): TOTAL HIP ARTHROPLASTY WITH AUTOGRAFT {Right.   They were given perioperative antibiotics:  Anti-infectives   Start     Dose/Rate Route Frequency Ordered Stop   07/03/12 1030  levofloxacin (LEVAQUIN) tablet 250 mg  Status:  Discontinued     250 mg Oral Daily 07/03/12 0928 07/04/12 1741   07/01/12 1430  ceFAZolin (ANCEF) IVPB 1 g/50 mL premix     1 g 100 mL/hr over 30 Minutes Intravenous Every 6 hours 07/01/12 1302 07/01/12 2039   07/01/12 0600  ceFAZolin (ANCEF) IVPB 2 g/50 mL premix     2 g 100 mL/hr over 30 Minutes Intravenous On call to O.R. 06/30/12 1419 07/01/12 0825    .  Tolerated the procedure well.  Placed with a foley intraoperatively.  Given Ofirmev at induction   POD #1, allowed out of bed to  a chair.  PT for ambulation and exercise program.  Foley D/C'd in morning.  IV saline locked.  O2 discontionued.  POD #2, continued PT and ambulation.  Marland Kitchen POD#3 supratherapeutic on coumadin held dose  The remainder of the hospital course was dedicated to ambulation and strengthening.   The patient was discharged on 3 Days Post-Op in stable condition.  Blood products given: 1 unit platelets intraop, 1 unit PRBC POD#1  DIAGNOSTIC STUDIES: Recent vital signs: Patient Vitals for the past 24 hrs:  BP Temp Pulse Resp SpO2  07/04/12 0538 103/56 mmHg 98.9 F (37.2 C) 96 18 98 %  07/04/12 0400 - - - 16 94 %  07/04/12 0000 - - - 16 94 %       Recent laboratory studies:  Recent Labs  07/02/12 0535 07/02/12 1044 07/03/12 0529 07/04/12 0500  WBC 8.9 8.3 6.6 5.6  HGB 9.8* 8.9* 9.3* 9.4*  HCT 28.6* 25.9* 26.7* 27.5*  PLT 115* 103* 78* 90*    Recent Labs  07/02/12 0535  NA 135  K 3.9  CL 101  CO2 27  BUN 8  CREATININE  0.71  GLUCOSE 132*  CALCIUM 8.1*   Lab Results  Component Value Date   INR 4.15* 07/04/2012   INR 3.30* 07/03/2012   INR 1.30 07/02/2012     Recent Radiographic Studies :  Dg Chest 2 View  06/27/2012  *RADIOLOGY REPORT*  Clinical Data: Preoperative respiratory films.  CHEST - 2 VIEW  Comparison: CT chest 01/09/2009 and single view of the chest 08/03/2011.  Findings: The lungs are clear.  Heart size is normal.  No pneumothorax or pleural fluid.  IMPRESSION: No acute disease.   Original Report Authenticated By: Holley Dexter, M.D.    Dg Pelvis Portable  07/01/2012  *RADIOLOGY REPORT*  Clinical Data: Postop right hip replacement  PORTABLE PELVIS  Comparison: 06/13/2012  Findings: Bipolar hip arthroplasty has been performed.  Components aligned the frontal plane.  Staples noted laterally.  Previous left hip arthroplasty as well.  Bony pelvis intact.  No hardware or osseous acute abnormality.  IMPRESSION: Expected appearance status post right hip arthroplasty.   Original  Report Authenticated By: Judie Petit. Shick, M.D.    Dg Hip Portable 1 View Right  07/01/2012  *RADIOLOGY REPORT*  Clinical Data: Postop right knee replacement  PORTABLE RIGHT HIP - 1 VIEW  Comparison: 06/13/2012, 07/01/2012  Findings: Expected appearance status post right hip arthroplasty. Components aligned in the lateral plane.  Staples noted laterally. No complicating feature.  IMPRESSION: Status post right hip arthroplasty.  Components aligned.   Original Report Authenticated By: Judie Petit. Shick, M.D.     DISCHARGE INSTRUCTIONS:   DISCHARGE MEDICATIONS:     Medication List    TAKE these medications       albuterol 108 (90 BASE) MCG/ACT inhaler  Commonly known as:  PROVENTIL HFA;VENTOLIN HFA  Inhale 2 puffs into the lungs every 6 (six) hours as needed. shortness of breath     albuterol (2.5 MG/3ML) 0.083% nebulizer solution  Commonly known as:  PROVENTIL  Take 2.5 mg by nebulization every 6 (six) hours as needed. wheezing/shortness of breath     aspirin EC 325 MG tablet  Take 1 tablet (325 mg total) by mouth 2 (two) times daily.     clonazePAM 0.5 MG tablet  Commonly known as:  KLONOPIN  Take 0.5 mg by mouth 3 (three) times daily.     cyclobenzaprine 10 MG tablet  Commonly known as:  FLEXERIL  Take 10 mg by mouth 3 (three) times daily.     Fluticasone-Salmeterol 500-50 MCG/DOSE Aepb  Commonly known as:  ADVAIR  Inhale 1 puff into the lungs every 12 (twelve) hours.     HYDROcodone-acetaminophen 10-325 MG per tablet  Commonly known as:  NORCO  Take 1 tablet by mouth every 4 (four) hours as needed. For pain     HYDROcodone-acetaminophen 10-325 MG per tablet  Commonly known as:  NORCO  Take 1 tablet by mouth every 4 (four) hours as needed for pain.     lisinopril 20 MG tablet  Commonly known as:  PRINIVIL,ZESTRIL  Take 20 mg by mouth daily.     omeprazole 20 MG capsule  Commonly known as:  PRILOSEC  Take 20 mg by mouth daily.     tiotropium 18 MCG inhalation capsule  Commonly  known as:  SPIRIVA  Place 18 mcg into inhaler and inhale daily.        FOLLOW UP VISIT:       Follow-up Information   Follow up with CAFFREY JR,W D, MD. Schedule an appointment as soon as possible for a visit  in 2 weeks.   Contact information:   [address] [place] [address] [phone]       DISPOSITION:   Home with HHPT and RN  CONDITION: stable   Margart Sickles 07/04/2012, 10:09 PM

## 2012-07-11 NOTE — Progress Notes (Signed)
Agree with change in goals.  Ivor, [place], [place] [phone]

## 2012-12-25 ENCOUNTER — Emergency Department (HOSPITAL_COMMUNITY)
Admission: EM | Admit: 2012-12-25 | Discharge: 2012-12-25 | Disposition: A | Discharge status: Home / Self Care | Payer: Medicaid Other | Attending: Emergency Medicine | Admitting: Emergency Medicine

## 2012-12-25 ENCOUNTER — Emergency Department (HOSPITAL_COMMUNITY): Payer: Medicaid Other

## 2012-12-25 ENCOUNTER — Encounter (HOSPITAL_COMMUNITY): Payer: Self-pay | Admitting: Emergency Medicine

## 2012-12-25 DIAGNOSIS — S62309A Unspecified fracture of unspecified metacarpal bone, initial encounter for closed fracture: Secondary | ICD-10-CM

## 2012-12-25 DIAGNOSIS — F17200 Nicotine dependence, unspecified, uncomplicated: Secondary | ICD-10-CM | POA: Insufficient documentation

## 2012-12-25 DIAGNOSIS — S62319A Displaced fracture of base of unspecified metacarpal bone, initial encounter for closed fracture: Secondary | ICD-10-CM | POA: Insufficient documentation

## 2012-12-25 DIAGNOSIS — M129 Arthropathy, unspecified: Secondary | ICD-10-CM | POA: Insufficient documentation

## 2012-12-25 DIAGNOSIS — I10 Essential (primary) hypertension: Secondary | ICD-10-CM | POA: Insufficient documentation

## 2012-12-25 DIAGNOSIS — J449 Chronic obstructive pulmonary disease, unspecified: Secondary | ICD-10-CM | POA: Insufficient documentation

## 2012-12-25 DIAGNOSIS — J4489 Other specified chronic obstructive pulmonary disease: Secondary | ICD-10-CM | POA: Insufficient documentation

## 2012-12-25 DIAGNOSIS — Z9981 Dependence on supplemental oxygen: Secondary | ICD-10-CM | POA: Insufficient documentation

## 2012-12-25 DIAGNOSIS — G8929 Other chronic pain: Secondary | ICD-10-CM | POA: Insufficient documentation

## 2012-12-25 DIAGNOSIS — Z79899 Other long term (current) drug therapy: Secondary | ICD-10-CM | POA: Insufficient documentation

## 2012-12-25 DIAGNOSIS — G40909 Epilepsy, unspecified, not intractable, without status epilepticus: Secondary | ICD-10-CM | POA: Insufficient documentation

## 2012-12-25 DIAGNOSIS — F411 Generalized anxiety disorder: Secondary | ICD-10-CM | POA: Insufficient documentation

## 2012-12-25 DIAGNOSIS — K219 Gastro-esophageal reflux disease without esophagitis: Secondary | ICD-10-CM | POA: Insufficient documentation

## 2012-12-25 DIAGNOSIS — IMO0002 Reserved for concepts with insufficient information to code with codable children: Secondary | ICD-10-CM | POA: Insufficient documentation

## 2012-12-25 DIAGNOSIS — M549 Dorsalgia, unspecified: Secondary | ICD-10-CM | POA: Insufficient documentation

## 2012-12-25 DIAGNOSIS — Y9383 Activity, rough housing and horseplay: Secondary | ICD-10-CM | POA: Insufficient documentation

## 2012-12-25 DIAGNOSIS — Y929 Unspecified place or not applicable: Secondary | ICD-10-CM | POA: Insufficient documentation

## 2012-12-25 DIAGNOSIS — G4730 Sleep apnea, unspecified: Secondary | ICD-10-CM | POA: Insufficient documentation

## 2012-12-25 MED ORDER — OXYCODONE-ACETAMINOPHEN 5-325 MG PO TABS: 1.0000 | ORAL_TABLET | ORAL | Status: AC | PRN

## 2012-12-25 MED ORDER — OXYCODONE-ACETAMINOPHEN 5-325 MG PO TABS: 2.0000 | ORAL_TABLET | Freq: Once | ORAL | Status: DC

## 2012-12-25 NOTE — ED Provider Notes (Signed)
CSN: [id number]     Arrival date & time 12/25/12  0602 History   First MD Initiated Contact with Patient 12/25/12 [phone]     Chief Complaint  Patient presents with  . Hand Injury   (Consider location/radiation/quality/duration/timing/severity/associated sxs/prior Treatment) HPI.... patient accidentally struck right hand while wrestling with her son yesterday afternoon.  Now hand is painful and puffy. No other injuries. Palpation and movement makes pain worse. Severity is mild to moderate.  Past Medical History  Diagnosis Date  . Asthma   . Bronchitis   . Hypertension   . GERD (gastroesophageal reflux disease)   . Chronic back pain   . Shortness of breath   . Headache(784.0)   . Anxiety   . COPD (chronic obstructive pulmonary disease)     O2 2 liters at bedtime  . Sleep apnea     stops breathing at night sometimes, uses o2 at 2L HS  . Seizures     hx 1 seizure 2000, none since  . Sciatic nerve pain   . Arthritis    Past Surgical History  Procedure Laterality Date  . Cholecystectomy    . Abdominal hysterectomy    . Tubal ligation    . Total hip arthroplasty  07/31/2011    Procedure: TOTAL HIP ARTHROPLASTY;  Surgeon: Thera Flake., MD;  Location: [facility] OR;  Service: Orthopedics;  Laterality: Left;  . Total hip arthroplasty Right 07/01/2012    Procedure: TOTAL HIP ARTHROPLASTY WITH AUTOGRAFT;  Surgeon: Thera Flake., MD;  Location: [facility] OR;  Service: Orthopedics;  Laterality: Right;   No family history on file. History  Substance Use Topics  . Smoking status: Current Every Day Smoker -- 0.50 packs/day for 33 years    Types: Cigarettes  . Smokeless tobacco: Never Used  . Alcohol Use: No   OB History   Grav Para Term Preterm Abortions TAB SAB Ect Mult Living                 Review of Systems  All other systems reviewed and are negative.    Allergies  Review of patient's allergies indicates no known allergies.  Home Medications   Current Outpatient Rx  Name  Route   Sig  Dispense  Refill  . albuterol (PROVENTIL HFA;VENTOLIN HFA) 108 (90 BASE) MCG/ACT inhaler   Inhalation   Inhale 2 puffs into the lungs every 6 (six) hours as needed. shortness of breath          . albuterol (PROVENTIL) (2.5 MG/3ML) 0.083% nebulizer solution   Nebulization   Take 2.5 mg by nebulization every 6 (six) hours as needed. wheezing/shortness of breath         . clonazePAM (KLONOPIN) 0.5 MG tablet   Oral   Take 0.5 mg by mouth 3 (three) times daily.          . cyclobenzaprine (FLEXERIL) 10 MG tablet   Oral   Take 10 mg by mouth 3 (three) times daily.         . Fluticasone-Salmeterol (ADVAIR) 500-50 MCG/DOSE AEPB   Inhalation   Inhale 1 puff into the lungs every 12 (twelve) hours.           Marland Kitchen HYDROcodone-acetaminophen (NORCO) 10-325 MG per tablet   Oral   Take 1 tablet by mouth every 4 (four) hours as needed. For pain   30 tablet   0   . HYDROcodone-acetaminophen (NORCO) 10-325 MG per tablet   Oral   Take 1 tablet  by mouth every 4 (four) hours as needed for pain.   90 tablet   0   . lisinopril (PRINIVIL,ZESTRIL) 20 MG tablet   Oral   Take 20 mg by mouth daily.           Marland Kitchen omeprazole (PRILOSEC) 20 MG capsule   Oral   Take 20 mg by mouth daily.         Marland Kitchen tiotropium (SPIRIVA) 18 MCG inhalation capsule   Inhalation   Place 18 mcg into inhaler and inhale daily.            BP 155/95  Pulse 84  Temp(Src) 97.6 F (36.4 C) (Oral)  Resp 20  Ht 5\' 6"  (1.676 m)  Wt 150 lb (68.04 kg)  BMI 24.22 kg/m2  SpO2 98% Physical Exam  Constitutional: She is oriented to person, place, and time. She appears well-developed and well-nourished.  HENT:  Head: Normocephalic and atraumatic.  Musculoskeletal:  Right hand tender over the second and fifth metacarpal.  Pain with range of motion  Neurological: She is alert and oriented to person, place, and time.  Skin: Skin is warm and dry.  Psychiatric: She has a normal mood and affect.    ED Course   Procedures (including critical care time) Labs Review Labs Reviewed - No data to display Imaging Review No results found.  EKG Interpretation   None     Dg Hand Complete Right  12/25/2012   CLINICAL DATA:  SWELLING.  TRAUMA.  EXAM: RIGHT HAND - COMPLETE 3+ VIEW  COMPARISON:  None available for comparison at time of study interpretation.  FINDINGS: NONDISPLACED INTRA-ARTICULAR CORNER FRACTURE OF THE MEDIAL ASPECT OF THE BASE OF THE 3RD PROXIMAL PHALANX, BONY FRAGMENT APPEARS MILDLY CORTICATED. NO ADDITIONAL FRACTURE DEFORMITY. NO DISLOCATION. NO DESTRUCTIVE BONY LESIONS. SOFT TISSUE PLANES ARE NONSUSPICIOUS.  IMPRESSION: NONDISPLACED AGE-INDETERMINATE BASE OF THE 3RD PROXIMAL PHALANX INTRA-ARTICULAR FRACTURE. NO DISLOCATION.   Electronically Signed   By: Awilda Metro   On: 12/25/2012 06:47    MDM  No diagnosis found.   Plain films of right hand show a nondisplaced intra-articular fracture of the medial aspect of the base of the third proximal phalanx.  Sugar tong splint applied. Pain medication. Referral to orthopedics.  Donnetta Hutching, MD 12/30/12 [phone]

## 2012-12-25 NOTE — ED Notes (Signed)
Pt was playing around with her son, went to hit him and her hand hit his hand. Onset 3pm on Sat. Pt hand is painful and swollen.

## 2012-12-27 ENCOUNTER — Ambulatory Visit (INDEPENDENT_AMBULATORY_CARE_PROVIDER_SITE_OTHER): Payer: Medicaid Other | Admitting: Orthopedic Surgery

## 2012-12-27 VITALS — BP 124/77 | Ht 66.0 in | Wt 147.0 lb

## 2012-12-27 DIAGNOSIS — S62619A Displaced fracture of proximal phalanx of unspecified finger, initial encounter for closed fracture: Secondary | ICD-10-CM

## 2012-12-27 DIAGNOSIS — IMO0002 Reserved for concepts with insufficient information to code with codable children: Secondary | ICD-10-CM

## 2012-12-27 DIAGNOSIS — S60221A Contusion of right hand, initial encounter: Secondary | ICD-10-CM

## 2012-12-27 DIAGNOSIS — S60229A Contusion of unspecified hand, initial encounter: Secondary | ICD-10-CM

## 2012-12-27 NOTE — Progress Notes (Signed)
Patient ID: [name], female   DOB: [birth year], 52 y.o.   MRN: [id number]  Chief Complaint  Patient presents with  . Hand Injury    Right hand injury Sunday, November 1 ER followup   HISTORY: 52 year old female says she was wrestling with her son. She went to hit him with her fist he put his hand up and she injured her hand and right long finger. The x-ray shows a right long finger proximal aspect proximal phalanx fracture at the joint margin. She also has a lot of swelling and pain and tenderness in the wrist joint itself and the dorsum of the hand. Complains of sharp constant 9/10 pain with numbness and tingling as well as swelling. She was initially treated with a sugar tong split to support the wrist and the hand  Review of systems she reports ordering of her eyes, shortness of breath, wheezing, cough. Heartburn, nausea, vomiting, diarrhea. Itching and skin redness. Numbness. Nervousness and anxiety. Easy bruising. Excessive urination. Everything else was reported as normal  She denies any allergies  Showed a right total hip in May the left total hip last May 2013. She complains of history of asthma and bronchitis  She is widowed. She smokes half-pack cigarettes per day does not drink. Listed her family history as negative  BP 124/77  Ht 5\' 6"  (1.676 m)  Wt 147 lb (66.679 kg)  BMI 23.74 kg/m2  General appearance is normal, the patient is alert and oriented x3 with normal mood and affect.   She walks with with a limp no assistive device  She is very thin   Her hand is swollen her wrist is tender her hand is tender the fracture site is tender She has decreased range of motion of the wrist and the metacarpophalangeal joints and IP joints. Her pain and swelling seem out of proportion to her reported injury on several occasions she was re\re question about it and says that's all she did was hit a hand.  Scans intact. Muscle tone normal. No atrophy. Pulse and sensation normal  distally. Epitrochlear nodes normal.  X-ray shows a proximal phalanx fracture of the right long finger right at the joint margin in the proximal aspect near the metacarpophalangeal joint  Impression fracture proximal phalanx right long finger Contusion right hand  Recommend ice elevation pain medication with Norco, she are to has  Splint long finger  Return in 2 weeks to start range of motion exercises and reassessed the injury  Treatment/Bill has normal office visit no fracture care  Encounter Diagnoses  Name Primary?  . Proximal phalanx fracture of finger, closed, initial encounter Yes  . Contusion, hand, right, initial encounter

## 2012-12-27 NOTE — Patient Instructions (Signed)
Apply ice to the hand 4 x a day   Keep the hand elevated   Return in 2 weeks   For re evaluation

## 2013-01-10 ENCOUNTER — Ambulatory Visit (INDEPENDENT_AMBULATORY_CARE_PROVIDER_SITE_OTHER): Payer: Medicaid Other | Admitting: Orthopedic Surgery

## 2013-01-10 VITALS — BP 140/90 | Ht 66.0 in | Wt 148.0 lb

## 2013-01-10 DIAGNOSIS — S60229A Contusion of unspecified hand, initial encounter: Secondary | ICD-10-CM

## 2013-01-10 DIAGNOSIS — S60221A Contusion of right hand, initial encounter: Secondary | ICD-10-CM

## 2013-01-10 DIAGNOSIS — IMO0002 Reserved for concepts with insufficient information to code with codable children: Secondary | ICD-10-CM

## 2013-01-10 DIAGNOSIS — S62619A Displaced fracture of proximal phalanx of unspecified finger, initial encounter for closed fracture: Secondary | ICD-10-CM

## 2013-01-10 NOTE — Progress Notes (Signed)
Patient ID: [name], female   DOB: 07-22-1960, 52 y.o.   MRN: [id number] Chief Complaint  Patient presents with  . Follow-up    2 week follow up right lnog finger phal fracture DOI 12/24/12    BP 140/90  Ht 5\' 6"  (1.676 m)  Wt 148 lb (67.132 kg)  BMI 23.90 kg/m2  Encounter Diagnoses  Name Primary?  . Contusion, hand, right, initial encounter Yes  . Proximal phalanx fracture of finger, closed, initial encounter    Review of systems noted on 12/27/2012 has not changed  Physical exam shows stable vital signs. She's awake alert and oriented x3 mood and affect are normal she is well-groomed. She is swelling in the thumb and index finger with tenderness tenderness at the fracture site passive range of motion intact but she resists examination no instability no deformity skin normal except for bruising in the thenar eminence and palmar aspect of the thumb   I still believe this patient had a different mechanism of injury. She still complains of quite a bit of pain at her wrist despite a negative x-ray. She's got pain in her thumb and index finger along with the fracture site area. She has painful range of motion passively and resists examination but is encouraged and demonstrated and exercises. She has Medicaid and will only get 3 therapy visits so I've started her on a home exercise program encouraged her to move her hand and showed her how to do the exercises. She will come back in 4 weeks if at that time she has improved we can send her for her 3 therapy visits and then discharge her

## 2013-01-10 NOTE — Patient Instructions (Signed)
Home exercises x 4 weeks

## 2013-01-29 ENCOUNTER — Emergency Department (HOSPITAL_COMMUNITY): Payer: Medicaid Other

## 2013-01-29 ENCOUNTER — Inpatient Hospital Stay (HOSPITAL_COMMUNITY)
Admission: EM | Admit: 2013-01-29 | Discharge: 2013-02-23 | DRG: 064 | Disposition: E | Payer: Medicaid Other | Attending: General Surgery | Admitting: General Surgery

## 2013-01-29 ENCOUNTER — Inpatient Hospital Stay (HOSPITAL_COMMUNITY): Payer: Medicaid Other

## 2013-01-29 ENCOUNTER — Other Ambulatory Visit: Payer: Self-pay

## 2013-01-29 ENCOUNTER — Encounter (HOSPITAL_COMMUNITY): Payer: Self-pay | Admitting: Emergency Medicine

## 2013-01-29 DIAGNOSIS — T794XXA Traumatic shock, initial encounter: Secondary | ICD-10-CM | POA: Diagnosis present

## 2013-01-29 DIAGNOSIS — S066X9A Traumatic subarachnoid hemorrhage with loss of consciousness of unspecified duration, initial encounter: Secondary | ICD-10-CM

## 2013-01-29 DIAGNOSIS — J449 Chronic obstructive pulmonary disease, unspecified: Secondary | ICD-10-CM | POA: Diagnosis present

## 2013-01-29 DIAGNOSIS — S22009A Unspecified fracture of unspecified thoracic vertebra, initial encounter for closed fracture: Secondary | ICD-10-CM | POA: Diagnosis present

## 2013-01-29 DIAGNOSIS — R739 Hyperglycemia, unspecified: Secondary | ICD-10-CM | POA: Diagnosis present

## 2013-01-29 DIAGNOSIS — I609 Nontraumatic subarachnoid hemorrhage, unspecified: Principal | ICD-10-CM | POA: Diagnosis present

## 2013-01-29 DIAGNOSIS — J95821 Acute postprocedural respiratory failure: Secondary | ICD-10-CM

## 2013-01-29 DIAGNOSIS — E872 Acidosis, unspecified: Secondary | ICD-10-CM | POA: Diagnosis present

## 2013-01-29 DIAGNOSIS — E119 Type 2 diabetes mellitus without complications: Secondary | ICD-10-CM | POA: Diagnosis present

## 2013-01-29 DIAGNOSIS — G40909 Epilepsy, unspecified, not intractable, without status epilepticus: Secondary | ICD-10-CM | POA: Diagnosis present

## 2013-01-29 DIAGNOSIS — J80 Acute respiratory distress syndrome: Secondary | ICD-10-CM

## 2013-01-29 DIAGNOSIS — J4489 Other specified chronic obstructive pulmonary disease: Secondary | ICD-10-CM | POA: Diagnosis present

## 2013-01-29 DIAGNOSIS — F329 Major depressive disorder, single episode, unspecified: Secondary | ICD-10-CM | POA: Diagnosis present

## 2013-01-29 DIAGNOSIS — L8990 Pressure ulcer of unspecified site, unspecified stage: Secondary | ICD-10-CM | POA: Diagnosis present

## 2013-01-29 DIAGNOSIS — J9589 Other postprocedural complications and disorders of respiratory system, not elsewhere classified: Secondary | ICD-10-CM | POA: Diagnosis present

## 2013-01-29 DIAGNOSIS — J9600 Acute respiratory failure, unspecified whether with hypoxia or hypercapnia: Secondary | ICD-10-CM

## 2013-01-29 DIAGNOSIS — F411 Generalized anxiety disorder: Secondary | ICD-10-CM | POA: Diagnosis present

## 2013-01-29 DIAGNOSIS — F17200 Nicotine dependence, unspecified, uncomplicated: Secondary | ICD-10-CM | POA: Diagnosis present

## 2013-01-29 DIAGNOSIS — K219 Gastro-esophageal reflux disease without esophagitis: Secondary | ICD-10-CM | POA: Diagnosis present

## 2013-01-29 DIAGNOSIS — E876 Hypokalemia: Secondary | ICD-10-CM | POA: Diagnosis present

## 2013-01-29 DIAGNOSIS — F3289 Other specified depressive episodes: Secondary | ICD-10-CM | POA: Diagnosis present

## 2013-01-29 LAB — CBC
HCT: 45.1 % (ref 36.0–46.0)
Hemoglobin: 15.4 g/dL — ABNORMAL HIGH (ref 12.0–15.0)
MCH: 30.7 pg (ref 26.0–34.0)
MCHC: 34.1 g/dL (ref 30.0–36.0)
WBC: 11.8 10*3/uL — ABNORMAL HIGH (ref 4.0–10.5)

## 2013-01-29 LAB — BLOOD GAS, ARTERIAL
Acid-base deficit: 8.5 mmol/L — ABNORMAL HIGH (ref 0.0–2.0)
Drawn by: 362771
FIO2: 1 %
O2 Saturation: 95.1 %
PEEP: 10 cmH2O
Patient temperature: 98.6
RATE: 16 resp/min
TCO2: 18 mmol/L (ref 0–100)
pO2, Arterial: 85.2 mmHg (ref 80.0–100.0)

## 2013-01-29 LAB — CG4 I-STAT (LACTIC ACID): Lactic Acid, Venous: 7.14 mmol/L — ABNORMAL HIGH (ref 0.5–2.2)

## 2013-01-29 LAB — COMPREHENSIVE METABOLIC PANEL
ALT: 26 U/L (ref 0–35)
AST: 46 U/L — ABNORMAL HIGH (ref 0–37)
BUN: 10 mg/dL (ref 6–23)
Calcium: 9 mg/dL (ref 8.4–10.5)
Glucose, Bld: 198 mg/dL — ABNORMAL HIGH (ref 70–99)
Sodium: 139 mEq/L (ref 135–145)
Total Protein: 7.8 g/dL (ref 6.0–8.3)

## 2013-01-29 LAB — POCT I-STAT 3, ART BLOOD GAS (G3+)
Bicarbonate: 21.2 mEq/L (ref 20.0–24.0)
O2 Saturation: 96 %
TCO2: 23 mmol/L (ref 0–100)
pCO2 arterial: 41.2 mmHg (ref 35.0–45.0)
pH, Arterial: 7.313 — ABNORMAL LOW (ref 7.350–7.450)
pO2, Arterial: 81 mmHg (ref 80.0–100.0)

## 2013-01-29 LAB — SAMPLE TO BLOOD BANK

## 2013-01-29 LAB — POCT I-STAT, CHEM 8
Creatinine, Ser: 1 mg/dL (ref 0.50–1.10)
HCT: 49 % — ABNORMAL HIGH (ref 36.0–46.0)
Hemoglobin: 16.7 g/dL — ABNORMAL HIGH (ref 12.0–15.0)
Potassium: 2.8 mEq/L — ABNORMAL LOW (ref 3.5–5.1)
Sodium: 144 mEq/L (ref 135–145)

## 2013-01-29 LAB — PROTIME-INR: INR: 1.08 (ref 0.00–1.49)

## 2013-01-29 LAB — CDS SEROLOGY

## 2013-01-29 MED ORDER — VASOPRESSIN 20 UNIT/ML IJ SOLN
0.0400 [IU]/min | INTRAVENOUS | Status: DC
  Administered 2013-01-29: 0.04 [IU]/min via INTRAVENOUS
  Filled 2013-01-29: qty 2.5

## 2013-01-29 MED ORDER — POTASSIUM CHLORIDE 10 MEQ/50ML IV SOLN
10.0000 meq | INTRAVENOUS | Status: AC
  Filled 2013-01-29: qty 50

## 2013-01-29 MED ORDER — SUCCINYLCHOLINE CHLORIDE 20 MG/ML IJ SOLN
INTRAMUSCULAR | Status: AC | PRN
  Administered 2013-01-29: 100 mg via INTRAVENOUS

## 2013-01-29 MED ORDER — IOHEXOL 300 MG/ML  SOLN
100.0000 mL | Freq: Once | INTRAMUSCULAR | Status: AC | PRN
  Administered 2013-01-29: 100 mL via INTRAVENOUS

## 2013-01-29 MED ORDER — ONDANSETRON HCL 4 MG/2ML IJ SOLN: 4.0000 mg | Freq: Four times a day (QID) | INTRAMUSCULAR | Status: DC | PRN

## 2013-01-29 MED ORDER — DEXAMETHASONE SODIUM PHOSPHATE 4 MG/ML IJ SOLN
4.0000 mg | Freq: Four times a day (QID) | INTRAMUSCULAR | Status: DC
  Administered 2013-01-29: 4 mg via INTRAVENOUS
  Filled 2013-01-29 (×2): qty 1

## 2013-01-29 MED ORDER — ETOMIDATE 2 MG/ML IV SOLN
INTRAVENOUS | Status: AC | PRN
  Administered 2013-01-29: 20 mg via INTRAVENOUS

## 2013-01-29 MED ORDER — MORPHINE SULFATE 4 MG/ML IJ SOLN
INTRAMUSCULAR | Status: AC
  Administered 2013-01-29: 4 mg
  Filled 2013-01-29: qty 1

## 2013-01-29 MED ORDER — BISACODYL 10 MG RE SUPP: 10.0000 mg | Freq: Every day | RECTAL | Status: DC | PRN

## 2013-01-29 MED ORDER — PANTOPRAZOLE SODIUM 40 MG PO TBEC: 40.0000 mg | DELAYED_RELEASE_TABLET | Freq: Every day | ORAL | Status: DC

## 2013-01-29 MED ORDER — VANCOMYCIN HCL IN DEXTROSE 1-5 GM/200ML-% IV SOLN
1000.0000 mg | Freq: Two times a day (BID) | INTRAVENOUS | Status: DC
  Administered 2013-01-30: 1000 mg via INTRAVENOUS
  Filled 2013-01-29 (×2): qty 200

## 2013-01-29 MED ORDER — NOREPINEPHRINE BITARTRATE 1 MG/ML IJ SOLN
2.0000 ug/min | INTRAMUSCULAR | Status: DC
  Administered 2013-01-29 – 2013-01-30 (×2): 20 ug/min via INTRAVENOUS
  Filled 2013-01-29 (×3): qty 4

## 2013-01-29 MED ORDER — SODIUM CHLORIDE 0.9 % IV SOLN
500.0000 mg | Freq: Two times a day (BID) | INTRAVENOUS | Status: DC
  Administered 2013-01-29: 500 mg via INTRAVENOUS
  Filled 2013-01-29 (×2): qty 5

## 2013-01-29 MED ORDER — SODIUM CHLORIDE 0.9 % IV SOLN
30.0000 ug/min | INTRAVENOUS | Status: DC
  Administered 2013-01-29: 10 ug/min via INTRAVENOUS
  Filled 2013-01-29: qty 1

## 2013-01-29 MED ORDER — PANTOPRAZOLE SODIUM 40 MG IV SOLR
40.0000 mg | Freq: Every day | INTRAVENOUS | Status: DC
  Administered 2013-01-30: 40 mg via INTRAVENOUS
  Filled 2013-01-29 (×2): qty 40

## 2013-01-29 MED ORDER — NIMODIPINE 30 MG PO CAPS
30.0000 mg | ORAL_CAPSULE | ORAL | Status: DC
  Filled 2013-01-29 (×4): qty 1

## 2013-01-29 MED ORDER — POTASSIUM CHLORIDE 10 MEQ/100ML IV SOLN
10.0000 meq | INTRAVENOUS | Status: AC
  Administered 2013-01-29 (×2): 10 meq via INTRAVENOUS
  Filled 2013-01-29 (×2): qty 100

## 2013-01-29 MED ORDER — PROPOFOL 10 MG/ML IV EMUL: 5.0000 ug/kg/min | INTRAVENOUS | Status: DC

## 2013-01-29 MED ORDER — KCL IN DEXTROSE-NACL 20-5-0.9 MEQ/L-%-% IV SOLN
INTRAVENOUS | Status: DC
  Administered 2013-01-29: 1000 mL via INTRAVENOUS
  Filled 2013-01-29 (×2): qty 1000

## 2013-01-29 MED ORDER — SODIUM CHLORIDE 0.9 % IV SOLN
30.0000 ug/min | INTRAVENOUS | Status: DC
  Administered 2013-01-29: 50 ug/min via INTRAVENOUS
  Filled 2013-01-29 (×2): qty 4

## 2013-01-29 MED ORDER — BIOTENE DRY MOUTH MT LIQD
15.0000 mL | Freq: Four times a day (QID) | OROMUCOSAL | Status: DC
  Administered 2013-01-30: 15 mL via OROMUCOSAL

## 2013-01-29 MED ORDER — ONDANSETRON HCL 4 MG PO TABS: 4.0000 mg | ORAL_TABLET | Freq: Four times a day (QID) | ORAL | Status: DC | PRN

## 2013-01-29 MED ORDER — CHLORHEXIDINE GLUCONATE 0.12 % MT SOLN
15.0000 mL | Freq: Two times a day (BID) | OROMUCOSAL | Status: DC
  Administered 2013-01-30: 15 mL via OROMUCOSAL
  Filled 2013-01-29: qty 15

## 2013-01-29 MED ORDER — INSULIN ASPART 100 UNIT/ML ~~LOC~~ SOLN
0.0000 [IU] | SUBCUTANEOUS | Status: DC
  Administered 2013-01-30: 1 [IU] via SUBCUTANEOUS

## 2013-01-29 MED ORDER — DEXTROSE 5 % IV SOLN
2.0000 g | Freq: Two times a day (BID) | INTRAVENOUS | Status: DC
  Administered 2013-01-30: 2 g via INTRAVENOUS
  Filled 2013-01-29 (×2): qty 2

## 2013-01-29 MED ORDER — HYDROMORPHONE HCL PF 1 MG/ML IJ SOLN: 1.0000 mg | INTRAMUSCULAR | Status: DC | PRN

## 2013-01-29 MED ORDER — CLINDAMYCIN PHOSPHATE 600 MG/50ML IV SOLN
600.0000 mg | Freq: Four times a day (QID) | INTRAVENOUS | Status: DC
  Administered 2013-01-30: 600 mg via INTRAVENOUS
  Filled 2013-01-29 (×3): qty 50

## 2013-01-29 MED ORDER — PROPOFOL 10 MG/ML IV EMUL
5.0000 ug/kg/min | Freq: Once | INTRAVENOUS | Status: AC
  Administered 2013-01-29: 35 ug/kg/min via INTRAVENOUS
  Filled 2013-01-29: qty 100
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
## 2013-01-29 NOTE — Progress Notes (Signed)
Pt transported to CT and back on vent. No complications noted.  

## 2013-01-29 NOTE — ED Notes (Signed)
NOTIFIED DR. Denton Lank IN PERSON OF PATIENTS LAB RESULTS OF CG4 LACTIC ACID = 7.47mmoI/L ,( K ) POTASSIUM = 2.51mmoI/L ,GLUCOSE = 191 mg/dl, @17 :25pm, 01/24/2013.

## 2013-01-29 NOTE — Procedures (Addendum)
Central Venous Catheter Insertion Procedure Note Jadaya Sommerfield [id number] [birth year]  Procedure: Insertion of Central Venous Catheter Indications: Assessment of intravascular volume, Drug and/or fluid administration and Frequent blood sampling  Procedure Details Consent: Risks of procedure as well as the alternatives and risks of each were explained to the (patient/caregiver).  Consent for procedure obtained. Time Out: Verified patient identification, verified procedure, site/side was marked, verified correct patient position, special equipment/implants available, medications/allergies/relevent history reviewed, required imaging and test results available.  Performed  Maximum sterile technique was used including antiseptics, cap, gloves, gown, hand hygiene, mask and sheet. Skin prep: Chlorhexidine; local anesthetic administered A antimicrobial bonded/coated triple lumen catheter was placed in the right subclavian vein using the Seldinger technique.  Evaluation Blood flow good Complications: No apparent complications Patient did tolerate procedure well. Chest X-ray ordered to verify placement.  CXR: pending.  Cathleen Yagi R. 01/23/2013, 9:29 PM

## 2013-01-29 NOTE — ED Notes (Signed)
Placed warm blankets on the patient and room temperature.

## 2013-01-29 NOTE — Procedures (Signed)
Arterial Catheter Insertion Procedure Note [name] [id number] Feb 21, 1961  Procedure: Insertion of Arterial Catheter  Indications: Blood pressure monitoring and Frequent blood sampling  Procedure Details Consent: Unable to obtain consent because of altered level of consciousness. Time Out: Verified patient identification, verified procedure, site/side was marked, verified correct patient position, special equipment/implants available, medications/allergies/relevent history reviewed, required imaging and test results available.  Performed  Maximum sterile technique was used including antiseptics, cap, gloves, gown, hand hygiene, mask and sheet. Skin prep: Chlorhexidine; local anesthetic administered 20 gauge catheter was inserted into left radial artery using the Seldinger technique.  Evaluation Blood flow good; BP tracing good. Complications: No apparent complications.   Inez Pilgrim 02/21/2013
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
## 2013-01-29 NOTE — ED Notes (Signed)
To CT scan with this RN

## 2013-01-29 NOTE — ED Notes (Signed)
Propofol started at this time at 

## 2013-01-30 ENCOUNTER — Inpatient Hospital Stay (HOSPITAL_COMMUNITY): Payer: Medicaid Other

## 2013-01-30 ENCOUNTER — Encounter (HOSPITAL_COMMUNITY): Payer: Self-pay | Admitting: Emergency Medicine

## 2013-01-30 LAB — HEMOGLOBIN A1C
Hgb A1c MFr Bld: 5.6 % (ref <5.7)
Mean Plasma Glucose: 114 mg/dL (ref <117)

## 2013-01-30 LAB — CBC
Hemoglobin: 9.5 g/dL — ABNORMAL LOW (ref 12.0–15.0)
MCH: 30.4 pg (ref 26.0–34.0)
MCV: 90.1 fL (ref 78.0–100.0)
Platelets: 102 10*3/uL — ABNORMAL LOW (ref 150–400)
RBC: 3.12 MIL/uL — ABNORMAL LOW (ref 3.87–5.11)
WBC: 8.1 10*3/uL (ref 4.0–10.5)

## 2013-01-30 LAB — COMPREHENSIVE METABOLIC PANEL
ALT: 14 U/L (ref 0–35)
AST: 33 U/L (ref 0–37)
Alkaline Phosphatase: 50 U/L (ref 39–117)
CO2: 19 mEq/L (ref 19–32)
Calcium: 5.2 mg/dL — CL (ref 8.4–10.5)
Chloride: 113 mEq/L — ABNORMAL HIGH (ref 96–112)
Creatinine, Ser: 0.75 mg/dL (ref 0.50–1.10)
GFR calc Af Amer: >90 mL/min (ref >90)
GFR calc non Af Amer: >90 mL/min (ref >90)
Glucose, Bld: 339 mg/dL — ABNORMAL HIGH (ref 70–99)
Potassium: 3 mEq/L — ABNORMAL LOW (ref 3.5–5.1)
Total Bilirubin: 0.3 mg/dL (ref 0.3–1.2)

## 2013-01-30 LAB — POCT I-STAT 3, ART BLOOD GAS (G3+)
Acid-base deficit: 18 mmol/L — ABNORMAL HIGH (ref 0.0–2.0)
Bicarbonate: 11.7 mEq/L — ABNORMAL LOW (ref 20.0–24.0)
Bicarbonate: 13.2 mEq/L — ABNORMAL LOW (ref 20.0–24.0)
Bicarbonate: 13.2 mEq/L — ABNORMAL LOW (ref 20.0–24.0)
O2 Saturation: 68 %
O2 Saturation: 71 %
O2 Saturation: 71 %
Patient temperature: 98.6
Patient temperature: 98.6
TCO2: 13 mmol/L (ref 0–100)
TCO2: 14 mmol/L (ref 0–100)
TCO2: 14 mmol/L (ref 0–100)
pCO2 arterial: 43.1 mmHg (ref 35.0–45.0)
pCO2 arterial: 43.1 mmHg (ref 35.0–45.0)
pH, Arterial: 7.095 — CL (ref 7.350–7.450)
pO2, Arterial: 50 mmHg — ABNORMAL LOW (ref 80.0–100.0)
pO2, Arterial: 50 mmHg — ABNORMAL LOW (ref 80.0–100.0)
pO2, Arterial: 50 mmHg — ABNORMAL LOW (ref 80.0–100.0)

## 2013-01-30 LAB — TROPONIN I
Troponin I: 11.77 ng/mL (ref <0.30)
Troponin I: 6.77 ng/mL (ref <0.30)

## 2013-01-30 LAB — LACTIC ACID, PLASMA
Lactic Acid, Venous: 5.5 mmol/L — ABNORMAL HIGH (ref 0.5–2.2)
Lactic Acid, Venous: 7.7 mmol/L — ABNORMAL HIGH (ref 0.5–2.2)

## 2013-01-30 MED ORDER — FENTANYL CITRATE 0.05 MG/ML IJ SOLN: 50.0000 ug | Freq: Once | INTRAMUSCULAR | Status: DC

## 2013-01-30 MED ORDER — SODIUM BICARBONATE 8.4 % IV SOLN
INTRAVENOUS | Status: AC
  Filled 2013-01-30: qty 50

## 2013-01-30 MED ORDER — SODIUM CHLORIDE 0.9 % IV SOLN
0.0000 ug/h | INTRAVENOUS | Status: DC
  Filled 2013-01-30: qty 50

## 2013-01-30 MED ORDER — POTASSIUM CHLORIDE 20 MEQ/15ML (10%) PO LIQD
40.0000 meq | Freq: Once | ORAL | Status: DC
  Filled 2013-01-30: qty 30

## 2013-01-30 MED ORDER — FENTANYL BOLUS VIA INFUSION
50.0000 ug | INTRAVENOUS | Status: DC | PRN
  Filled 2013-01-30: qty 100

## 2013-01-30 MED ORDER — FENTANYL CITRATE 0.05 MG/ML IJ SOLN
INTRAMUSCULAR | Status: AC
  Filled 2013-01-30: qty 2

## 2013-01-30 MED ORDER — SODIUM BICARBONATE 8.4 % IV SOLN
INTRAVENOUS | Status: DC
  Filled 2013-01-30 (×2): qty 150

## 2013-01-30 MED ORDER — NIMODIPINE 60 MG/20ML PO SOLN
30.0000 mg | ORAL | Status: DC
  Administered 2013-01-30: 30 mg via ORAL
  Filled 2013-01-30 (×9): qty 10

## 2013-01-30 MED ORDER — ROCURONIUM BROMIDE 50 MG/5ML IV SOLN
1.0000 mg/kg | Freq: Once | INTRAVENOUS | Status: AC
  Administered 2013-01-30: 71.9 mg via INTRAVENOUS

## 2013-01-30 MED ORDER — EPINEPHRINE HCL 1 MG/ML IJ SOLN: 0.5000 ug/min | INTRAMUSCULAR | Status: DC

## 2013-01-30 MED FILL — Medication: Qty: 1 | Status: AC

## 2013-01-30 NOTE — Progress Notes (Signed)
eLink Physician-Brief Progress Note Patient Name: Rhyleigh Grassel DOB: 01/21/1961 MRN: [id number]  Date of Service  01/25/2013   HPI/Events of Note     eICU Interventions  Hypokalemia, repleted      MCQUAID, DOUGLAS 02/06/2013, 1:32 AM

## 2013-01-30 NOTE — Progress Notes (Signed)
250 ml Fentanyl drip wasted in sink, witness by RN Katrinka Blazing.

## 2013-01-30 NOTE — Progress Notes (Signed)
CRITICAL VALUE ALERT  Critical value received:  Troponin 11.77  Date of notification:  02/02/2013  Time of notification:  0200  Critical value read back:yes  Nurse who received alert:  Barbarann Ehlers, RN  MD notified (1st page): Dr. Curt Bears  Time of first page:  0200  MD notified (2nd page):  Time of second page:  Responding MD:  Dr. Curt Bears  Time MD responded:  0200

## 2013-01-30 NOTE — Progress Notes (Signed)
1914 pt expired after 2 previous rounds of CPR. Dr Curt Bears declared time of death. Family present.

## 2013-01-30 NOTE — Progress Notes (Signed)
Patient's family arrived from [facility]. Chaplain offered ministry of presence. Chaplain provided emotional and spiritual support to patient's family.   02/08/2013 1821  Clinical Encounter Type  Visited With Family  Visit Type Initial;Spiritual support  Spiritual Encounters  Spiritual Needs Emotional

## 2013-01-30 NOTE — Progress Notes (Signed)
Chaplain paged to provide family support. Chaplain offered ministry of presence, emotional and spiritual support.   Feb 28, 2013 0328  Clinical Encounter Type  Visited With Family  Visit Type Follow-up;Spiritual support  Spiritual Encounters  Spiritual Needs Emotional;Grief support  Stress Factors  Family Stress Factors Health changes;Loss;Major life changes
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
## 2013-01-30 NOTE — Progress Notes (Signed)
eLink Physician-Brief Progress Note Patient Name: [name] DOB: 1960/07/05 MRN: [id number]  Date of Service  02/02/2013   HPI/Events of Note   Severe Shock Vent asynchrony  eICU Interventions  Bolus saline now Fentanyl bolus now    Intervention Category Major Interventions: Respiratory failure - evaluation and management Intermediate Interventions: Hypotension - evaluation and management  Reubin Bushnell 02/01/2013, 1:37 AM

## 2013-01-30 NOTE — Progress Notes (Signed)
eLink Physician-Brief Progress Note Patient Name: [name] DOB: 04-19-1960 MRN: [id number]  Date of Service  02/01/2013   HPI/Events of Note  Asynchrony despite fentanyl boluses and fentanyl Propofol gtt while patient in state of shock  eICU Interventions  Stop propofol Change to fentanyl gtt Bolus saline Rocuronium x1 given severe asynchrony and hypoxemia RT to bag lavage CXR port stat   Intervention Category Major Interventions: Respiratory failure - evaluation and management Intermediate Interventions: Hypotension - evaluation and management  Quanika Solem 01/29/2013, 1:39 AM

## 2013-01-30 NOTE — Progress Notes (Signed)
Called to patient bedside for acute decompensation. On my arrival, patient profoundly hypoxic with the arterial pressures in the low 40s. She quickly lost a pulse. ACLS begun and a pulse regained after 1 round of CPR and one dose of epinephrine. ABG profoundly acidemic. Bicarbonate drip started along with aggressive ventilation. Patient currently maxed out on 4 pressors with very tenuous status. Chest x-ray reviewed; and no obvious pneumothorax. EKG reviewed; sinus rhythm without obvious ST changes. Possible new right bundle branch block. Family on their way; prognosis terrible.

## 2013-01-30 NOTE — Progress Notes (Signed)
Changed per md verbal order.

## 2013-01-30 NOTE — Progress Notes (Signed)
Changes per MD at this time.
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
## 2013-01-30 NOTE — Progress Notes (Addendum)
Called again to patient's bedside for cardiac arrest. On my arrival, to code team had just completed 1 round of CPR with 1 mg of epinephrine and junction with return of spontaneous circulation. I went out to discuss the patient's prognosis with her gathered her family members. Unfortunately, during that time, the patient became severely bradycardic and again became pulseless. At that time, given her failure to respond to maximum medical therapy including aggressive ventilator management, maximum doses of 4 pressors, a bicarbonate drip, and a wide-open fluids, the decision was made to cease resuscitative efforts as they were futile. The family was informed. Time of death was 3:25 AM.
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
## 2013-02-05 LAB — CULTURE, BLOOD (ROUTINE X 2): Culture: NO GROWTH

## 2013-02-07 ENCOUNTER — Ambulatory Visit: Payer: Medicaid Other | Admitting: Orthopedic Surgery
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
## 2013-02-11 NOTE — Discharge Summary (Signed)
agree

## 2013-02-23 DEATH — deceased

## 2013-04-23 DEATH — deceased

## 2014-12-18 IMAGING — CR DG HIP 1V PORT*R*
1 series · 1 of 1 positions shown · non-contrast
Comparison: 06/13/2012, 07/01/2012

CLINICAL DATA: Postop right knee replacement

PORTABLE RIGHT HIP - 1 VIEW

[AP]
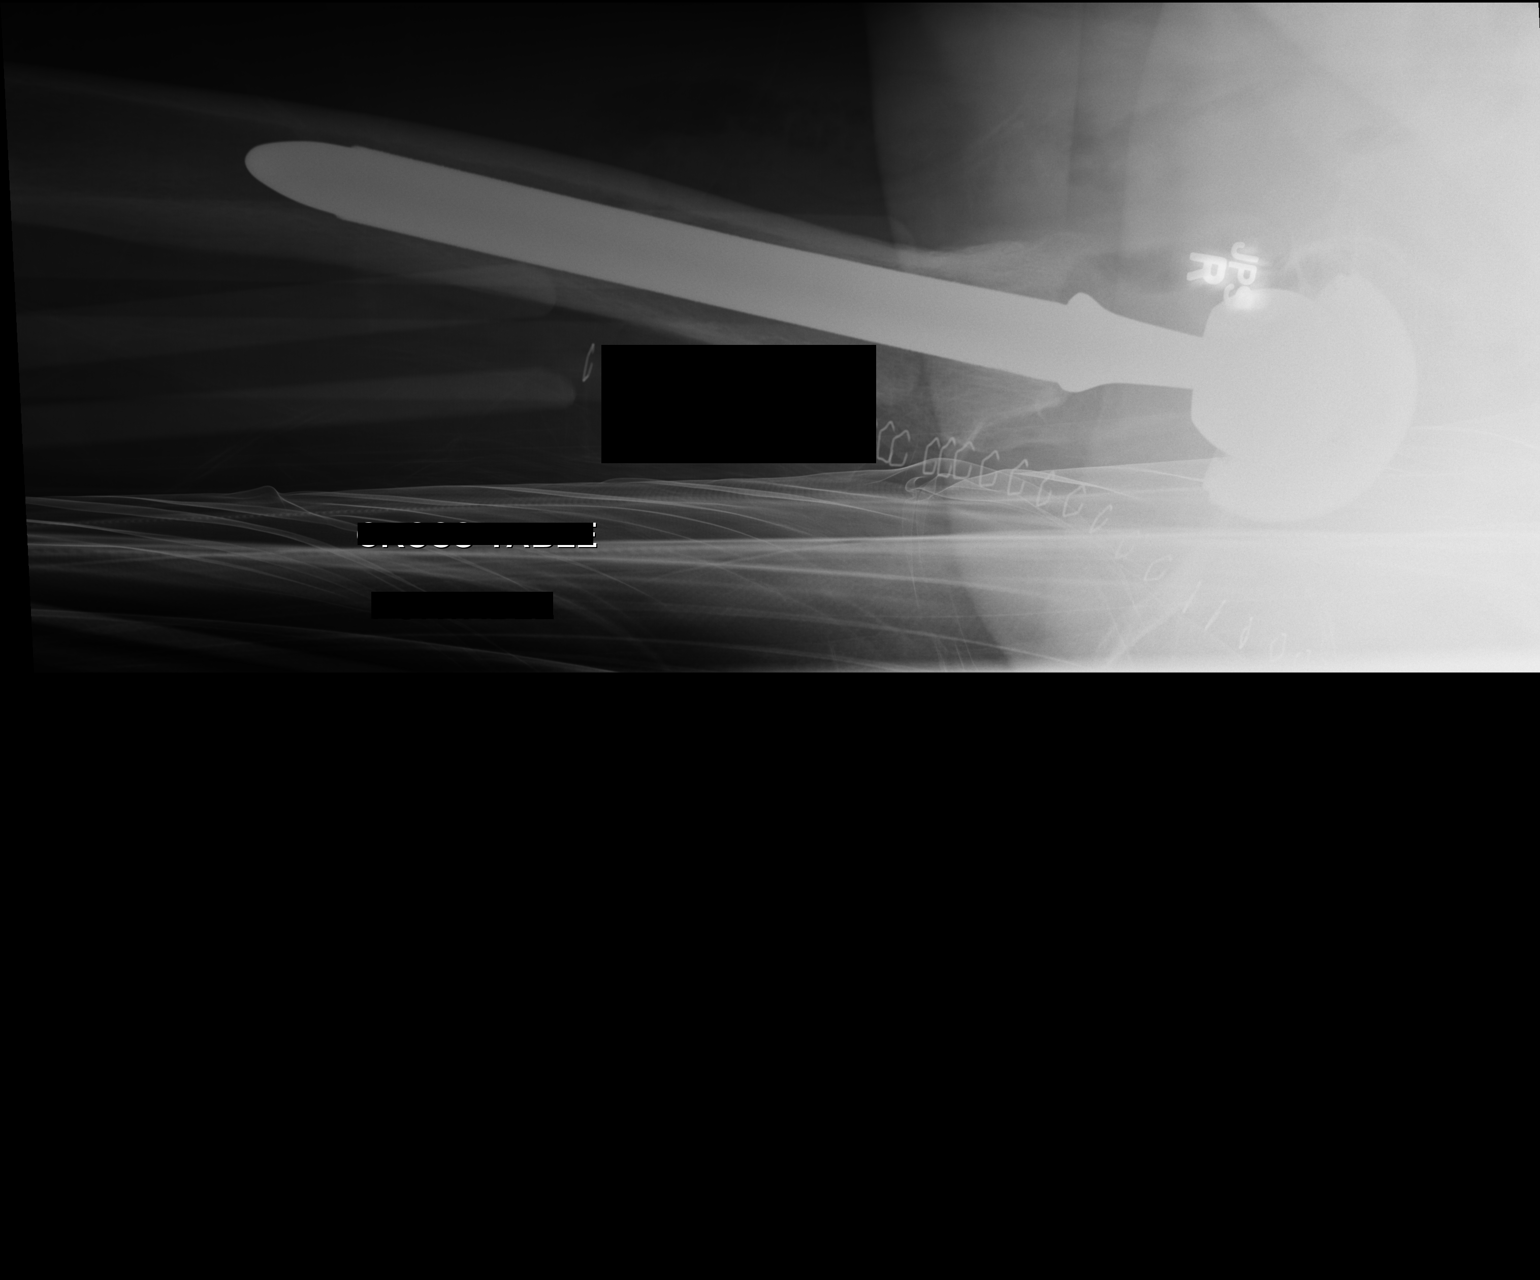

[1 of 1 positions shown; findings below may reference images not displayed]

FINDINGS: Expected appearance status post right hip arthroplasty.
Components aligned in the lateral plane.  Staples noted laterally.
No complicating feature.
IMPRESSION: Status post right hip arthroplasty.  Components aligned.
